# Patient Record
Sex: Female | Born: 1950 | Race: White | Hispanic: No | Marital: Single | Smoking: Never smoker
Health system: Southern US, Community
[De-identification: ages and names within clinical notes are randomized; demographics above are authoritative.]

## PROBLEM LIST (undated history)

## (undated) DIAGNOSIS — F419 Anxiety disorder, unspecified: Secondary | ICD-10-CM

## (undated) DIAGNOSIS — E785 Hyperlipidemia, unspecified: Secondary | ICD-10-CM

## (undated) DIAGNOSIS — F32A Depression, unspecified: Secondary | ICD-10-CM

## (undated) DIAGNOSIS — M2040 Other hammer toe(s) (acquired), unspecified foot: Secondary | ICD-10-CM

## (undated) DIAGNOSIS — F329 Major depressive disorder, single episode, unspecified: Secondary | ICD-10-CM

## (undated) DIAGNOSIS — I10 Essential (primary) hypertension: Secondary | ICD-10-CM

## (undated) DIAGNOSIS — E039 Hypothyroidism, unspecified: Secondary | ICD-10-CM

## (undated) HISTORY — DX: Essential (primary) hypertension: I10

## (undated) HISTORY — DX: Anxiety disorder, unspecified: F41.9

## (undated) HISTORY — DX: Depression, unspecified: F32.A

## (undated) HISTORY — DX: Hypothyroidism, unspecified: E03.9

## (undated) HISTORY — PX: VEIN LIGATION AND STRIPPING: SHX2653

## (undated) HISTORY — DX: Hyperlipidemia, unspecified: E78.5

## (undated) HISTORY — DX: Major depressive disorder, single episode, unspecified: F32.9

---

## 1998-03-29 ENCOUNTER — Encounter: Admission: RE | Admit: 1998-03-29 | Discharge: 1998-03-29 | Payer: Self-pay | Admitting: Sports Medicine

## 1998-08-01 ENCOUNTER — Encounter: Admission: RE | Admit: 1998-08-01 | Discharge: 1998-08-01 | Payer: Self-pay | Admitting: Family Medicine

## 1998-08-24 ENCOUNTER — Encounter: Admission: RE | Admit: 1998-08-24 | Discharge: 1998-08-24 | Payer: Self-pay | Admitting: Family Medicine

## 1998-10-21 ENCOUNTER — Encounter: Admission: RE | Admit: 1998-10-21 | Discharge: 1998-10-21 | Payer: Self-pay | Admitting: Family Medicine

## 1998-12-30 ENCOUNTER — Encounter: Payer: Self-pay | Admitting: Vascular Surgery

## 1999-01-02 ENCOUNTER — Observation Stay (HOSPITAL_COMMUNITY): Admission: RE | Admit: 1999-01-02 | Discharge: 1999-01-03 | Payer: Self-pay | Admitting: Vascular Surgery

## 1999-01-02 ENCOUNTER — Encounter (INDEPENDENT_AMBULATORY_CARE_PROVIDER_SITE_OTHER): Payer: Self-pay | Admitting: Specialist

## 1999-01-24 ENCOUNTER — Encounter: Admission: RE | Admit: 1999-01-24 | Discharge: 1999-01-24 | Payer: Self-pay | Admitting: Sports Medicine

## 1999-02-08 ENCOUNTER — Encounter: Admission: RE | Admit: 1999-02-08 | Discharge: 1999-02-08 | Payer: Self-pay | Admitting: Family Medicine

## 1999-03-29 ENCOUNTER — Encounter: Admission: RE | Admit: 1999-03-29 | Discharge: 1999-03-29 | Payer: Self-pay | Admitting: Family Medicine

## 1999-04-26 ENCOUNTER — Encounter: Admission: RE | Admit: 1999-04-26 | Discharge: 1999-04-26 | Payer: Self-pay | Admitting: Family Medicine

## 1999-09-29 ENCOUNTER — Encounter: Admission: RE | Admit: 1999-09-29 | Discharge: 1999-09-29 | Payer: Self-pay | Admitting: Family Medicine

## 1999-10-17 ENCOUNTER — Encounter: Admission: RE | Admit: 1999-10-17 | Discharge: 1999-10-17 | Payer: Self-pay | Admitting: Family Medicine

## 1999-11-02 ENCOUNTER — Encounter: Admission: RE | Admit: 1999-11-02 | Discharge: 1999-11-02 | Payer: Self-pay | Admitting: Family Medicine

## 1999-11-09 ENCOUNTER — Encounter: Admission: RE | Admit: 1999-11-09 | Discharge: 1999-11-09 | Payer: Self-pay | Admitting: Family Medicine

## 1999-11-14 ENCOUNTER — Encounter: Admission: RE | Admit: 1999-11-14 | Discharge: 1999-11-14 | Payer: Self-pay | Admitting: Sports Medicine

## 1999-11-28 ENCOUNTER — Encounter: Admission: RE | Admit: 1999-11-28 | Discharge: 1999-11-28 | Payer: Self-pay | Admitting: Family Medicine

## 1999-12-01 ENCOUNTER — Encounter: Payer: Self-pay | Admitting: Family Medicine

## 1999-12-01 ENCOUNTER — Encounter: Admission: RE | Admit: 1999-12-01 | Discharge: 1999-12-01 | Payer: Self-pay | Admitting: Family Medicine

## 2000-03-23 ENCOUNTER — Emergency Department (HOSPITAL_COMMUNITY): Admission: EM | Admit: 2000-03-23 | Discharge: 2000-03-23 | Payer: Self-pay | Admitting: Emergency Medicine

## 2000-03-27 ENCOUNTER — Encounter: Admission: RE | Admit: 2000-03-27 | Discharge: 2000-03-27 | Payer: Self-pay | Admitting: Family Medicine

## 2000-03-29 ENCOUNTER — Encounter: Admission: RE | Admit: 2000-03-29 | Discharge: 2000-03-29 | Payer: Self-pay | Admitting: Family Medicine

## 2000-06-07 ENCOUNTER — Encounter: Admission: RE | Admit: 2000-06-07 | Discharge: 2000-06-07 | Payer: Self-pay | Admitting: Family Medicine

## 2000-08-29 ENCOUNTER — Encounter: Admission: RE | Admit: 2000-08-29 | Discharge: 2000-08-29 | Payer: Self-pay | Admitting: Family Medicine

## 2000-09-03 ENCOUNTER — Encounter: Admission: RE | Admit: 2000-09-03 | Discharge: 2000-09-03 | Payer: Self-pay | Admitting: Family Medicine

## 2000-10-22 ENCOUNTER — Encounter: Admission: RE | Admit: 2000-10-22 | Discharge: 2000-10-22 | Payer: Self-pay | Admitting: Family Medicine

## 2001-04-29 ENCOUNTER — Encounter: Admission: RE | Admit: 2001-04-29 | Discharge: 2001-04-29 | Payer: Self-pay | Admitting: Family Medicine

## 2001-08-22 ENCOUNTER — Encounter: Admission: RE | Admit: 2001-08-22 | Discharge: 2001-08-22 | Payer: Self-pay | Admitting: Family Medicine

## 2002-02-10 ENCOUNTER — Encounter: Admission: RE | Admit: 2002-02-10 | Discharge: 2002-02-10 | Payer: Self-pay | Admitting: Family Medicine

## 2002-02-16 ENCOUNTER — Encounter: Payer: Self-pay | Admitting: Family Medicine

## 2002-02-16 ENCOUNTER — Encounter: Admission: RE | Admit: 2002-02-16 | Discharge: 2002-02-16 | Payer: Self-pay | Admitting: Family Medicine

## 2002-03-10 ENCOUNTER — Encounter: Admission: RE | Admit: 2002-03-10 | Discharge: 2002-03-10 | Payer: Self-pay | Admitting: Family Medicine

## 2002-04-09 ENCOUNTER — Encounter: Admission: RE | Admit: 2002-04-09 | Discharge: 2002-04-09 | Payer: Self-pay | Admitting: Family Medicine

## 2002-10-20 ENCOUNTER — Encounter: Admission: RE | Admit: 2002-10-20 | Discharge: 2002-10-20 | Payer: Self-pay | Admitting: Family Medicine

## 2003-01-01 ENCOUNTER — Encounter: Payer: Self-pay | Admitting: Sports Medicine

## 2003-01-01 ENCOUNTER — Encounter: Admission: RE | Admit: 2003-01-01 | Discharge: 2003-01-01 | Payer: Self-pay | Admitting: Family Medicine

## 2003-01-01 ENCOUNTER — Encounter: Admission: RE | Admit: 2003-01-01 | Discharge: 2003-01-01 | Payer: Self-pay | Admitting: Sports Medicine

## 2003-03-30 ENCOUNTER — Encounter: Admission: RE | Admit: 2003-03-30 | Discharge: 2003-03-30 | Payer: Self-pay | Admitting: Family Medicine

## 2003-04-02 ENCOUNTER — Encounter: Admission: RE | Admit: 2003-04-02 | Discharge: 2003-04-02 | Payer: Self-pay | Admitting: Sports Medicine

## 2003-08-31 ENCOUNTER — Encounter: Admission: RE | Admit: 2003-08-31 | Discharge: 2003-08-31 | Payer: Self-pay | Admitting: Family Medicine

## 2003-09-10 ENCOUNTER — Encounter: Admission: RE | Admit: 2003-09-10 | Discharge: 2003-09-10 | Payer: Self-pay | Admitting: Sports Medicine

## 2003-09-27 ENCOUNTER — Encounter: Admission: RE | Admit: 2003-09-27 | Discharge: 2003-12-26 | Payer: Self-pay | Admitting: Family Medicine

## 2004-07-25 ENCOUNTER — Ambulatory Visit: Payer: Self-pay | Admitting: Family Medicine

## 2004-07-31 ENCOUNTER — Ambulatory Visit: Payer: Self-pay | Admitting: Family Medicine

## 2004-08-16 ENCOUNTER — Ambulatory Visit: Payer: Self-pay | Admitting: Family Medicine

## 2004-10-01 ENCOUNTER — Emergency Department (HOSPITAL_COMMUNITY): Admission: EM | Admit: 2004-10-01 | Discharge: 2004-10-01 | Payer: Self-pay | Admitting: Emergency Medicine

## 2004-11-17 ENCOUNTER — Ambulatory Visit: Payer: Self-pay | Admitting: Sports Medicine

## 2004-11-21 ENCOUNTER — Ambulatory Visit: Payer: Self-pay | Admitting: Family Medicine

## 2004-11-30 ENCOUNTER — Ambulatory Visit: Payer: Self-pay | Admitting: Family Medicine

## 2004-12-15 ENCOUNTER — Encounter: Admission: RE | Admit: 2004-12-15 | Discharge: 2004-12-15 | Payer: Self-pay | Admitting: Family Medicine

## 2004-12-19 ENCOUNTER — Ambulatory Visit: Payer: Self-pay | Admitting: Family Medicine

## 2005-01-26 ENCOUNTER — Ambulatory Visit: Payer: Self-pay | Admitting: Sports Medicine

## 2005-02-22 ENCOUNTER — Ambulatory Visit: Payer: Self-pay | Admitting: Family Medicine

## 2005-03-06 ENCOUNTER — Ambulatory Visit: Payer: Self-pay | Admitting: Family Medicine

## 2005-07-30 ENCOUNTER — Ambulatory Visit: Payer: Self-pay | Admitting: Sports Medicine

## 2005-08-02 ENCOUNTER — Emergency Department (HOSPITAL_COMMUNITY): Admission: EM | Admit: 2005-08-02 | Discharge: 2005-08-02 | Payer: Self-pay | Admitting: Emergency Medicine

## 2005-08-07 ENCOUNTER — Ambulatory Visit: Payer: Self-pay | Admitting: Family Medicine

## 2005-08-13 ENCOUNTER — Ambulatory Visit: Payer: Self-pay | Admitting: Family Medicine

## 2005-09-18 ENCOUNTER — Emergency Department (HOSPITAL_COMMUNITY): Admission: EM | Admit: 2005-09-18 | Discharge: 2005-09-18 | Payer: Self-pay | Admitting: Emergency Medicine

## 2005-09-18 ENCOUNTER — Ambulatory Visit: Payer: Self-pay | Admitting: Family Medicine

## 2005-09-20 ENCOUNTER — Ambulatory Visit: Payer: Self-pay | Admitting: Family Medicine

## 2005-09-21 ENCOUNTER — Ambulatory Visit: Payer: Self-pay | Admitting: Family Medicine

## 2005-09-25 ENCOUNTER — Encounter (INDEPENDENT_AMBULATORY_CARE_PROVIDER_SITE_OTHER): Payer: Self-pay | Admitting: *Deleted

## 2005-09-25 ENCOUNTER — Ambulatory Visit: Payer: Self-pay | Admitting: Family Medicine

## 2005-10-09 ENCOUNTER — Encounter: Payer: Self-pay | Admitting: Family Medicine

## 2005-10-09 ENCOUNTER — Ambulatory Visit: Payer: Self-pay | Admitting: Family Medicine

## 2005-10-09 ENCOUNTER — Ambulatory Visit (HOSPITAL_COMMUNITY): Admission: RE | Admit: 2005-10-09 | Discharge: 2005-10-09 | Payer: Self-pay | Admitting: Family Medicine

## 2005-10-16 ENCOUNTER — Ambulatory Visit: Payer: Self-pay | Admitting: Family Medicine

## 2005-10-29 ENCOUNTER — Ambulatory Visit: Payer: Self-pay | Admitting: Family Medicine

## 2005-10-29 ENCOUNTER — Inpatient Hospital Stay (HOSPITAL_COMMUNITY): Admission: RE | Admit: 2005-10-29 | Discharge: 2005-11-04 | Payer: Self-pay | Admitting: Psychiatry

## 2005-10-29 ENCOUNTER — Ambulatory Visit: Payer: Self-pay | Admitting: Psychiatry

## 2005-11-06 ENCOUNTER — Ambulatory Visit (HOSPITAL_COMMUNITY): Admission: RE | Admit: 2005-11-06 | Discharge: 2005-11-06 | Payer: Self-pay | Admitting: Family Medicine

## 2005-11-06 ENCOUNTER — Ambulatory Visit: Payer: Self-pay | Admitting: Family Medicine

## 2005-11-07 ENCOUNTER — Ambulatory Visit: Payer: Self-pay | Admitting: Family Medicine

## 2005-11-12 ENCOUNTER — Ambulatory Visit: Payer: Self-pay | Admitting: Family Medicine

## 2005-11-14 ENCOUNTER — Emergency Department (HOSPITAL_COMMUNITY): Admission: EM | Admit: 2005-11-14 | Discharge: 2005-11-14 | Payer: Self-pay | Admitting: Emergency Medicine

## 2005-11-15 ENCOUNTER — Ambulatory Visit: Payer: Self-pay | Admitting: Sports Medicine

## 2005-11-17 ENCOUNTER — Emergency Department (HOSPITAL_COMMUNITY): Admission: EM | Admit: 2005-11-17 | Discharge: 2005-11-17 | Payer: Self-pay | Admitting: Emergency Medicine

## 2005-11-19 ENCOUNTER — Ambulatory Visit: Payer: Self-pay | Admitting: Family Medicine

## 2005-11-20 ENCOUNTER — Ambulatory Visit: Payer: Self-pay | Admitting: Family Medicine

## 2005-11-23 ENCOUNTER — Ambulatory Visit: Payer: Self-pay | Admitting: Family Medicine

## 2005-11-27 ENCOUNTER — Ambulatory Visit: Payer: Self-pay | Admitting: Sports Medicine

## 2005-12-02 ENCOUNTER — Emergency Department (HOSPITAL_COMMUNITY): Admission: EM | Admit: 2005-12-02 | Discharge: 2005-12-02 | Payer: Self-pay | Admitting: Emergency Medicine

## 2005-12-04 ENCOUNTER — Ambulatory Visit: Payer: Self-pay | Admitting: Family Medicine

## 2005-12-11 ENCOUNTER — Ambulatory Visit: Payer: Self-pay | Admitting: Sports Medicine

## 2005-12-16 ENCOUNTER — Emergency Department (HOSPITAL_COMMUNITY): Admission: EM | Admit: 2005-12-16 | Discharge: 2005-12-17 | Payer: Self-pay | Admitting: Emergency Medicine

## 2005-12-25 ENCOUNTER — Ambulatory Visit: Payer: Self-pay | Admitting: Family Medicine

## 2006-01-03 ENCOUNTER — Ambulatory Visit: Payer: Self-pay | Admitting: Family Medicine

## 2006-01-09 ENCOUNTER — Ambulatory Visit: Payer: Self-pay | Admitting: Family Medicine

## 2006-01-11 ENCOUNTER — Ambulatory Visit: Payer: Self-pay | Admitting: Family Medicine

## 2006-01-12 ENCOUNTER — Emergency Department (HOSPITAL_COMMUNITY): Admission: EM | Admit: 2006-01-12 | Discharge: 2006-01-12 | Payer: Self-pay | Admitting: Emergency Medicine

## 2006-01-17 ENCOUNTER — Ambulatory Visit: Payer: Self-pay | Admitting: Family Medicine

## 2006-01-18 ENCOUNTER — Emergency Department (HOSPITAL_COMMUNITY): Admission: EM | Admit: 2006-01-18 | Discharge: 2006-01-19 | Payer: Self-pay | Admitting: Emergency Medicine

## 2006-02-01 ENCOUNTER — Ambulatory Visit: Payer: Self-pay | Admitting: Family Medicine

## 2006-02-11 ENCOUNTER — Ambulatory Visit: Payer: Self-pay | Admitting: Family Medicine

## 2006-02-18 ENCOUNTER — Ambulatory Visit: Payer: Self-pay | Admitting: Sports Medicine

## 2006-02-28 ENCOUNTER — Ambulatory Visit: Payer: Self-pay | Admitting: Family Medicine

## 2006-03-11 ENCOUNTER — Encounter: Payer: Self-pay | Admitting: Family Medicine

## 2006-03-11 ENCOUNTER — Ambulatory Visit: Payer: Self-pay | Admitting: Sports Medicine

## 2006-03-11 LAB — CONVERTED CEMR LAB: LDL Cholesterol: 257 mg/dL

## 2006-03-25 ENCOUNTER — Ambulatory Visit: Payer: Self-pay | Admitting: Sports Medicine

## 2006-04-01 ENCOUNTER — Ambulatory Visit: Payer: Self-pay | Admitting: Sports Medicine

## 2006-05-07 ENCOUNTER — Ambulatory Visit: Payer: Self-pay | Admitting: Family Medicine

## 2006-05-08 ENCOUNTER — Ambulatory Visit: Payer: Self-pay | Admitting: Family Medicine

## 2006-05-10 ENCOUNTER — Ambulatory Visit: Payer: Self-pay | Admitting: Sports Medicine

## 2006-05-10 ENCOUNTER — Emergency Department (HOSPITAL_COMMUNITY): Admission: EM | Admit: 2006-05-10 | Discharge: 2006-05-10 | Payer: Self-pay | Admitting: Emergency Medicine

## 2006-05-11 ENCOUNTER — Emergency Department (HOSPITAL_COMMUNITY): Admission: EM | Admit: 2006-05-11 | Discharge: 2006-05-11 | Payer: Self-pay | Admitting: Emergency Medicine

## 2006-05-14 ENCOUNTER — Ambulatory Visit: Payer: Self-pay | Admitting: Family Medicine

## 2006-05-15 ENCOUNTER — Emergency Department (HOSPITAL_COMMUNITY): Admission: EM | Admit: 2006-05-15 | Discharge: 2006-05-15 | Payer: Self-pay | Admitting: Emergency Medicine

## 2006-05-17 ENCOUNTER — Ambulatory Visit: Payer: Self-pay | Admitting: Family Medicine

## 2006-05-19 ENCOUNTER — Emergency Department (HOSPITAL_COMMUNITY): Admission: EM | Admit: 2006-05-19 | Discharge: 2006-05-19 | Payer: Self-pay | Admitting: Emergency Medicine

## 2006-05-21 ENCOUNTER — Emergency Department (HOSPITAL_COMMUNITY): Admission: EM | Admit: 2006-05-21 | Discharge: 2006-05-21 | Payer: Self-pay | Admitting: Emergency Medicine

## 2006-05-23 ENCOUNTER — Emergency Department (HOSPITAL_COMMUNITY): Admission: EM | Admit: 2006-05-23 | Discharge: 2006-05-23 | Payer: Self-pay | Admitting: Emergency Medicine

## 2006-05-24 ENCOUNTER — Emergency Department (HOSPITAL_COMMUNITY): Admission: EM | Admit: 2006-05-24 | Discharge: 2006-05-25 | Payer: Self-pay | Admitting: Emergency Medicine

## 2006-05-26 ENCOUNTER — Emergency Department (HOSPITAL_COMMUNITY): Admission: EM | Admit: 2006-05-26 | Discharge: 2006-05-26 | Payer: Self-pay | Admitting: Emergency Medicine

## 2006-05-30 ENCOUNTER — Emergency Department (HOSPITAL_COMMUNITY): Admission: EM | Admit: 2006-05-30 | Discharge: 2006-05-30 | Payer: Self-pay | Admitting: Family Medicine

## 2006-05-30 ENCOUNTER — Emergency Department (HOSPITAL_COMMUNITY): Admission: EM | Admit: 2006-05-30 | Discharge: 2006-05-30 | Payer: Self-pay | Admitting: Emergency Medicine

## 2006-05-31 ENCOUNTER — Ambulatory Visit: Payer: Self-pay | Admitting: Family Medicine

## 2006-05-31 ENCOUNTER — Emergency Department (HOSPITAL_COMMUNITY): Admission: EM | Admit: 2006-05-31 | Discharge: 2006-05-31 | Payer: Self-pay | Admitting: Emergency Medicine

## 2006-06-01 ENCOUNTER — Emergency Department (HOSPITAL_COMMUNITY): Admission: EM | Admit: 2006-06-01 | Discharge: 2006-06-01 | Payer: Self-pay | Admitting: Emergency Medicine

## 2006-06-02 ENCOUNTER — Emergency Department (HOSPITAL_COMMUNITY): Admission: EM | Admit: 2006-06-02 | Discharge: 2006-06-02 | Payer: Self-pay | Admitting: Emergency Medicine

## 2006-06-04 ENCOUNTER — Emergency Department (HOSPITAL_COMMUNITY): Admission: EM | Admit: 2006-06-04 | Discharge: 2006-06-04 | Payer: Self-pay | Admitting: Emergency Medicine

## 2006-06-05 ENCOUNTER — Ambulatory Visit: Payer: Self-pay | Admitting: Family Medicine

## 2006-06-10 ENCOUNTER — Inpatient Hospital Stay (HOSPITAL_COMMUNITY): Admission: AD | Admit: 2006-06-10 | Discharge: 2006-06-15 | Payer: Self-pay | Admitting: *Deleted

## 2006-06-10 ENCOUNTER — Ambulatory Visit: Payer: Self-pay | Admitting: *Deleted

## 2006-07-09 ENCOUNTER — Ambulatory Visit: Payer: Self-pay | Admitting: Family Medicine

## 2006-07-16 ENCOUNTER — Emergency Department (HOSPITAL_COMMUNITY): Admission: EM | Admit: 2006-07-16 | Discharge: 2006-07-16 | Payer: Self-pay | Admitting: Emergency Medicine

## 2006-07-19 ENCOUNTER — Emergency Department (HOSPITAL_COMMUNITY): Admission: EM | Admit: 2006-07-19 | Discharge: 2006-07-19 | Payer: Self-pay | Admitting: Emergency Medicine

## 2006-07-25 DIAGNOSIS — E785 Hyperlipidemia, unspecified: Secondary | ICD-10-CM

## 2006-07-25 DIAGNOSIS — F29 Unspecified psychosis not due to a substance or known physiological condition: Secondary | ICD-10-CM | POA: Insufficient documentation

## 2006-07-25 DIAGNOSIS — I8390 Asymptomatic varicose veins of unspecified lower extremity: Secondary | ICD-10-CM

## 2006-07-25 DIAGNOSIS — G5600 Carpal tunnel syndrome, unspecified upper limb: Secondary | ICD-10-CM

## 2006-07-25 DIAGNOSIS — H9319 Tinnitus, unspecified ear: Secondary | ICD-10-CM | POA: Insufficient documentation

## 2006-07-25 DIAGNOSIS — E049 Nontoxic goiter, unspecified: Secondary | ICD-10-CM | POA: Insufficient documentation

## 2006-07-25 DIAGNOSIS — M949 Disorder of cartilage, unspecified: Secondary | ICD-10-CM

## 2006-07-25 DIAGNOSIS — M899 Disorder of bone, unspecified: Secondary | ICD-10-CM | POA: Insufficient documentation

## 2006-07-25 DIAGNOSIS — J309 Allergic rhinitis, unspecified: Secondary | ICD-10-CM | POA: Insufficient documentation

## 2006-07-25 DIAGNOSIS — F319 Bipolar disorder, unspecified: Secondary | ICD-10-CM | POA: Insufficient documentation

## 2006-07-25 DIAGNOSIS — E669 Obesity, unspecified: Secondary | ICD-10-CM

## 2006-07-26 ENCOUNTER — Encounter (INDEPENDENT_AMBULATORY_CARE_PROVIDER_SITE_OTHER): Payer: Self-pay | Admitting: *Deleted

## 2006-07-30 ENCOUNTER — Encounter: Payer: Self-pay | Admitting: Family Medicine

## 2006-07-30 ENCOUNTER — Telehealth: Payer: Self-pay | Admitting: *Deleted

## 2006-08-02 ENCOUNTER — Telehealth: Payer: Self-pay | Admitting: *Deleted

## 2006-08-04 ENCOUNTER — Emergency Department (HOSPITAL_COMMUNITY): Admission: EM | Admit: 2006-08-04 | Discharge: 2006-08-04 | Payer: Self-pay | Admitting: Emergency Medicine

## 2006-08-29 ENCOUNTER — Telehealth (INDEPENDENT_AMBULATORY_CARE_PROVIDER_SITE_OTHER): Payer: Self-pay | Admitting: Family Medicine

## 2006-09-03 ENCOUNTER — Emergency Department (HOSPITAL_COMMUNITY): Admission: EM | Admit: 2006-09-03 | Discharge: 2006-09-03 | Payer: Self-pay | Admitting: Emergency Medicine

## 2006-09-16 ENCOUNTER — Telehealth: Payer: Self-pay | Admitting: Family Medicine

## 2006-09-16 ENCOUNTER — Encounter: Payer: Self-pay | Admitting: Family Medicine

## 2006-09-26 ENCOUNTER — Telehealth: Payer: Self-pay | Admitting: *Deleted

## 2006-10-09 ENCOUNTER — Ambulatory Visit: Payer: Self-pay | Admitting: Family Medicine

## 2006-10-15 ENCOUNTER — Encounter: Payer: Self-pay | Admitting: *Deleted

## 2006-10-16 ENCOUNTER — Telehealth: Payer: Self-pay | Admitting: *Deleted

## 2006-10-17 ENCOUNTER — Encounter (INDEPENDENT_AMBULATORY_CARE_PROVIDER_SITE_OTHER): Payer: Self-pay | Admitting: *Deleted

## 2006-10-22 ENCOUNTER — Ambulatory Visit: Payer: Self-pay | Admitting: Family Medicine

## 2006-11-10 ENCOUNTER — Emergency Department (HOSPITAL_COMMUNITY): Admission: EM | Admit: 2006-11-10 | Discharge: 2006-11-11 | Payer: Self-pay | Admitting: Emergency Medicine

## 2006-11-23 ENCOUNTER — Telehealth (INDEPENDENT_AMBULATORY_CARE_PROVIDER_SITE_OTHER): Payer: Self-pay | Admitting: Family Medicine

## 2006-12-10 ENCOUNTER — Telehealth: Payer: Self-pay | Admitting: Family Medicine

## 2007-01-21 ENCOUNTER — Encounter: Payer: Self-pay | Admitting: *Deleted

## 2007-03-10 ENCOUNTER — Telehealth: Payer: Self-pay | Admitting: *Deleted

## 2007-03-11 ENCOUNTER — Encounter: Payer: Self-pay | Admitting: *Deleted

## 2007-03-11 ENCOUNTER — Ambulatory Visit: Payer: Self-pay | Admitting: Family Medicine

## 2007-03-15 ENCOUNTER — Telehealth (INDEPENDENT_AMBULATORY_CARE_PROVIDER_SITE_OTHER): Payer: Self-pay | Admitting: Family Medicine

## 2007-03-16 ENCOUNTER — Telehealth (INDEPENDENT_AMBULATORY_CARE_PROVIDER_SITE_OTHER): Payer: Self-pay | Admitting: Family Medicine

## 2007-03-19 ENCOUNTER — Encounter: Payer: Self-pay | Admitting: Family Medicine

## 2007-03-19 ENCOUNTER — Encounter: Payer: Self-pay | Admitting: *Deleted

## 2007-03-31 ENCOUNTER — Telehealth: Payer: Self-pay | Admitting: Family Medicine

## 2007-04-01 ENCOUNTER — Telehealth: Payer: Self-pay | Admitting: Family Medicine

## 2007-04-03 ENCOUNTER — Encounter: Payer: Self-pay | Admitting: *Deleted

## 2007-04-08 ENCOUNTER — Encounter: Payer: Self-pay | Admitting: *Deleted

## 2007-04-09 ENCOUNTER — Encounter: Payer: Self-pay | Admitting: *Deleted

## 2007-04-15 ENCOUNTER — Telehealth (INDEPENDENT_AMBULATORY_CARE_PROVIDER_SITE_OTHER): Payer: Self-pay | Admitting: Family Medicine

## 2007-04-16 ENCOUNTER — Encounter: Payer: Self-pay | Admitting: *Deleted

## 2007-04-22 ENCOUNTER — Emergency Department (HOSPITAL_COMMUNITY): Admission: EM | Admit: 2007-04-22 | Discharge: 2007-04-22 | Payer: Self-pay | Admitting: Emergency Medicine

## 2007-04-22 ENCOUNTER — Ambulatory Visit: Payer: Self-pay | Admitting: Family Medicine

## 2007-04-22 DIAGNOSIS — T7421XA Adult sexual abuse, confirmed, initial encounter: Secondary | ICD-10-CM

## 2007-04-22 LAB — CONVERTED CEMR LAB
ALT: 8 units/L (ref 0–35)
Albumin: 4 g/dL (ref 3.5–5.2)
CO2: 18 meq/L — ABNORMAL LOW (ref 19–32)
Calcium: 9 mg/dL (ref 8.4–10.5)
HCT: 47.5 % — ABNORMAL HIGH (ref 36.0–46.0)
MCHC: 32.2 g/dL (ref 30.0–36.0)
MCV: 97.7 fL (ref 78.0–100.0)
Platelets: 349 10*3/uL (ref 150–400)
Potassium: 4.2 meq/L (ref 3.5–5.3)
RBC: 4.86 M/uL (ref 3.87–5.11)
RDW: 13 % (ref 11.5–15.5)
TSH: 2.462 microintl units/mL (ref 0.350–5.50)
Whiff Test: NEGATIVE

## 2007-04-26 ENCOUNTER — Inpatient Hospital Stay (HOSPITAL_COMMUNITY): Admission: EM | Admit: 2007-04-26 | Discharge: 2007-05-07 | Payer: Self-pay | Admitting: Psychiatry

## 2007-04-28 ENCOUNTER — Ambulatory Visit: Payer: Self-pay | Admitting: Psychiatry

## 2007-07-15 ENCOUNTER — Ambulatory Visit: Payer: Self-pay | Admitting: Family Medicine

## 2007-07-15 ENCOUNTER — Ambulatory Visit (HOSPITAL_COMMUNITY): Admission: RE | Admit: 2007-07-15 | Discharge: 2007-07-15 | Payer: Self-pay | Admitting: Family Medicine

## 2007-07-15 DIAGNOSIS — R0789 Other chest pain: Secondary | ICD-10-CM

## 2007-07-15 LAB — CONVERTED CEMR LAB
Cholesterol, target level: 200 mg/dL
HDL goal, serum: 40 mg/dL

## 2007-07-21 ENCOUNTER — Encounter: Payer: Self-pay | Admitting: Family Medicine

## 2007-10-08 ENCOUNTER — Telehealth: Payer: Self-pay | Admitting: Family Medicine

## 2007-10-09 ENCOUNTER — Telehealth: Payer: Self-pay | Admitting: Family Medicine

## 2007-10-22 ENCOUNTER — Emergency Department (HOSPITAL_COMMUNITY): Admission: EM | Admit: 2007-10-22 | Discharge: 2007-10-22 | Payer: Self-pay | Admitting: Emergency Medicine

## 2007-10-27 ENCOUNTER — Telehealth: Payer: Self-pay | Admitting: *Deleted

## 2007-10-28 ENCOUNTER — Encounter: Payer: Self-pay | Admitting: *Deleted

## 2007-10-28 ENCOUNTER — Encounter: Payer: Self-pay | Admitting: Family Medicine

## 2007-11-02 ENCOUNTER — Inpatient Hospital Stay (HOSPITAL_COMMUNITY): Admission: AD | Admit: 2007-11-02 | Discharge: 2007-11-02 | Payer: Self-pay | Admitting: Obstetrics and Gynecology

## 2007-11-05 ENCOUNTER — Telehealth (INDEPENDENT_AMBULATORY_CARE_PROVIDER_SITE_OTHER): Payer: Self-pay | Admitting: Family Medicine

## 2007-11-09 ENCOUNTER — Emergency Department (HOSPITAL_COMMUNITY): Admission: EM | Admit: 2007-11-09 | Discharge: 2007-11-09 | Payer: Self-pay | Admitting: Emergency Medicine

## 2007-11-10 ENCOUNTER — Encounter: Payer: Self-pay | Admitting: *Deleted

## 2007-11-10 ENCOUNTER — Ambulatory Visit: Payer: Self-pay | Admitting: Family Medicine

## 2007-11-16 ENCOUNTER — Inpatient Hospital Stay (HOSPITAL_COMMUNITY): Admission: AD | Admit: 2007-11-16 | Discharge: 2007-12-04 | Payer: Self-pay | Admitting: Psychiatry

## 2007-11-16 ENCOUNTER — Other Ambulatory Visit: Payer: Self-pay | Admitting: Emergency Medicine

## 2007-11-16 ENCOUNTER — Ambulatory Visit: Payer: Self-pay | Admitting: Psychiatry

## 2007-11-17 ENCOUNTER — Telehealth: Payer: Self-pay | Admitting: Family Medicine

## 2007-11-28 ENCOUNTER — Encounter: Payer: Self-pay | Admitting: Psychiatry

## 2009-01-06 IMAGING — CR DG CHEST 2V
2 series · 2 of 2 positions shown · non-contrast
Comparison: 09/18/2005

CLINICAL DATA: Chest pain

CHEST - 2 VIEW

[w chest pa]
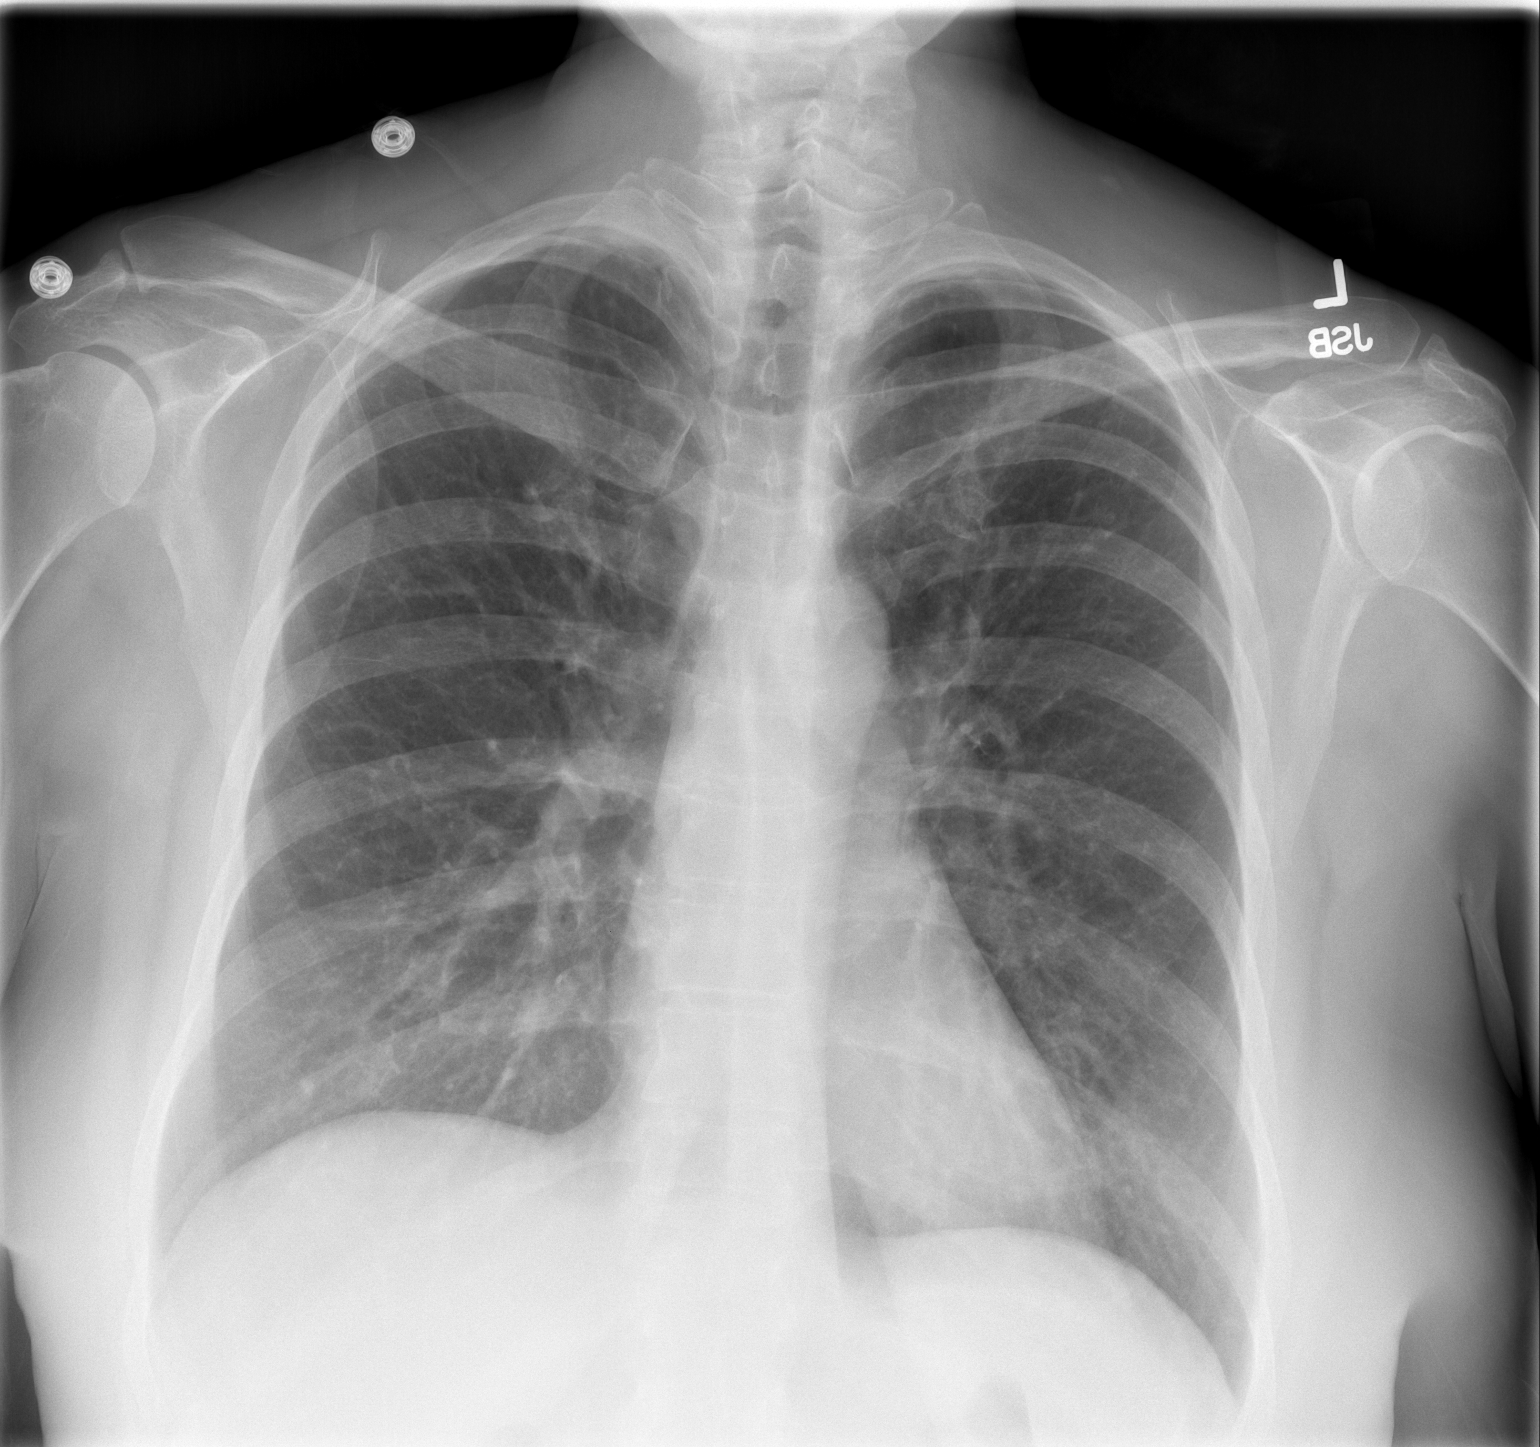

[w chest lat]
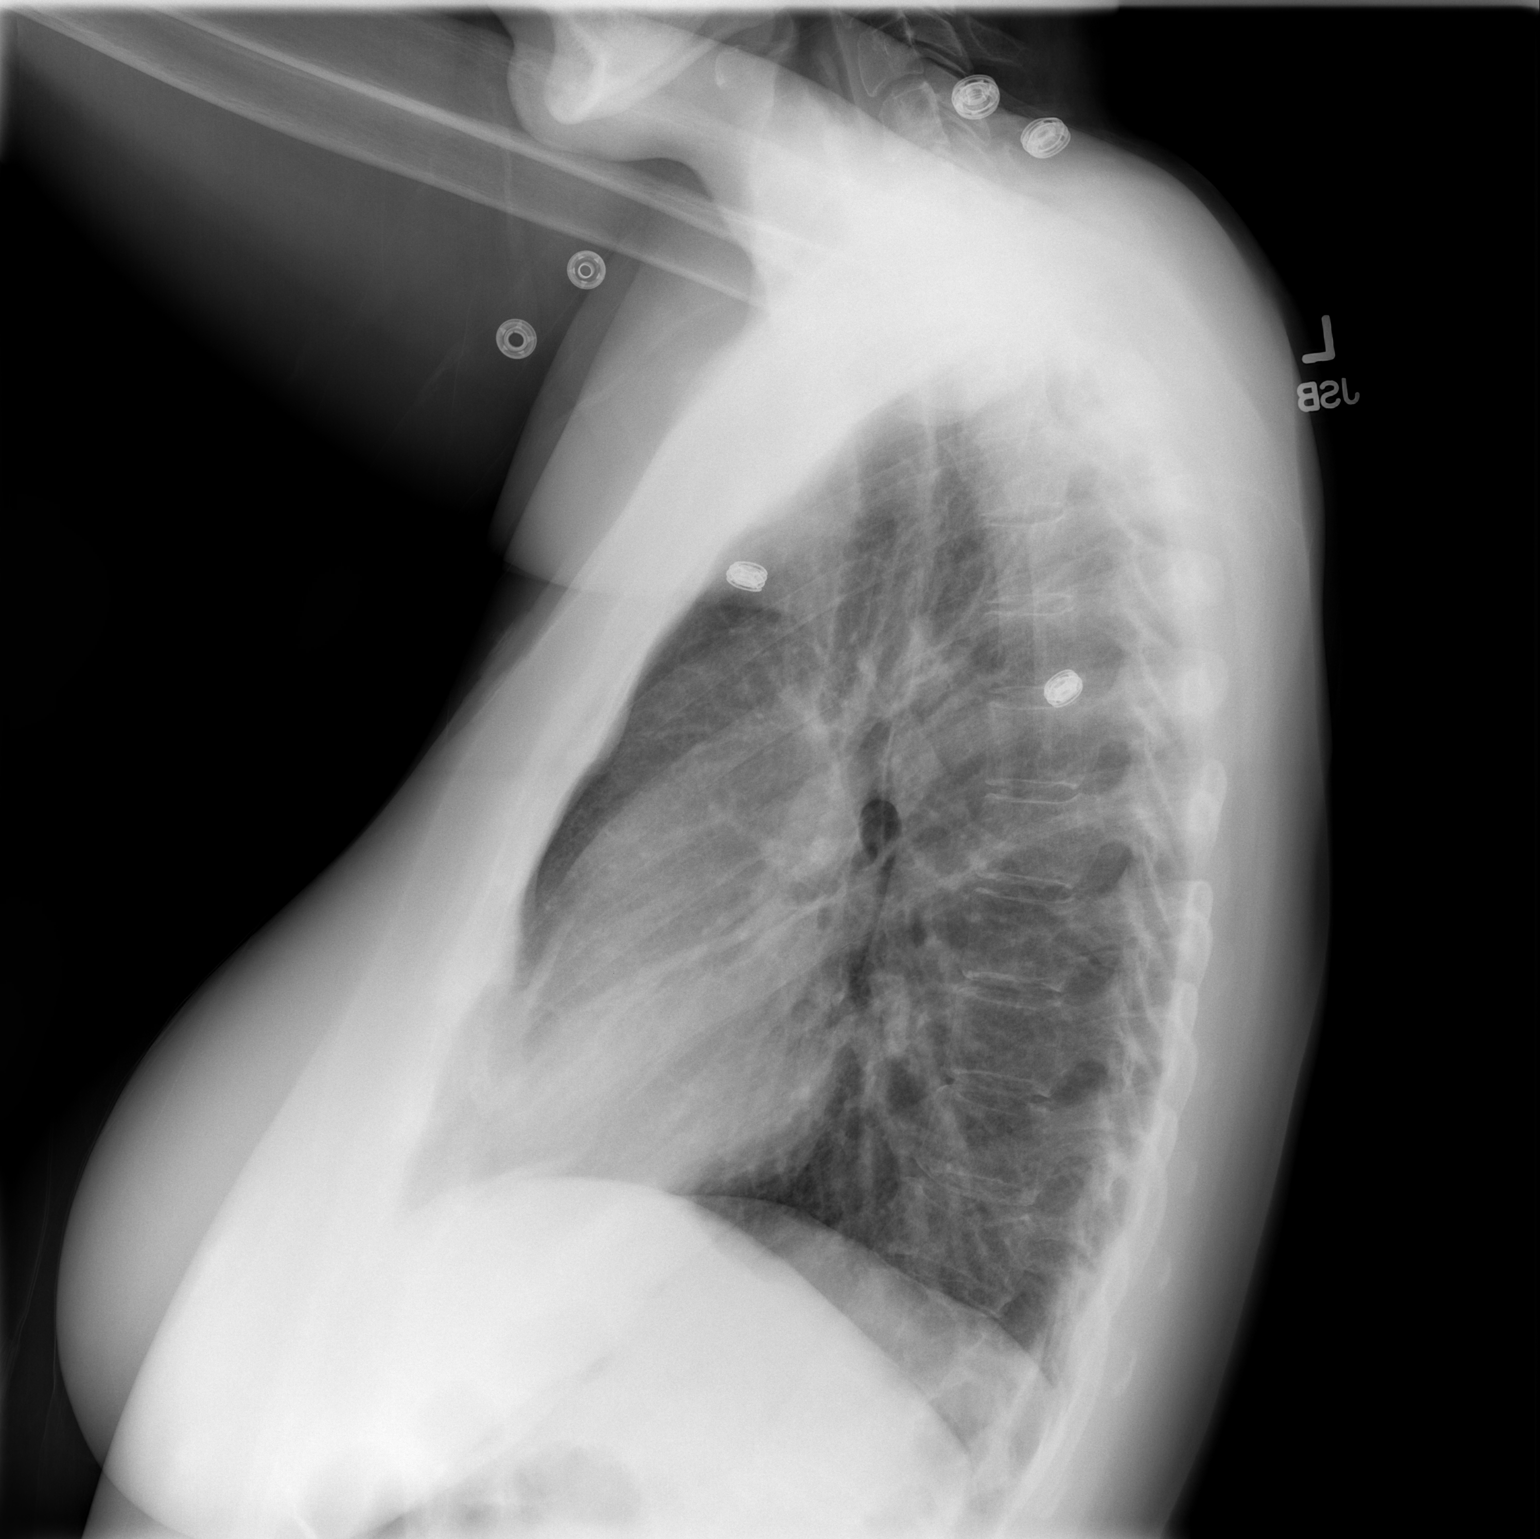

[2 of 2 positions shown; findings below may reference images not displayed]

FINDINGS: Lungs clear.  Heart size and pulmonary vascularity
normal.  No effusion.  Visualized bones unremarkable.]
IMPRESSION: [1.  No acute disease

## 2009-02-08 ENCOUNTER — Encounter: Payer: Self-pay | Admitting: Family Medicine

## 2009-02-12 IMAGING — CR DG CHEST 2V
2 series · 2 of 2 positions shown · non-contrast
Comparison: Chest x-ray 10/22/2007

CLINICAL DATA: Leukocytosis.

CHEST - 2 VIEW

[w chest pa]
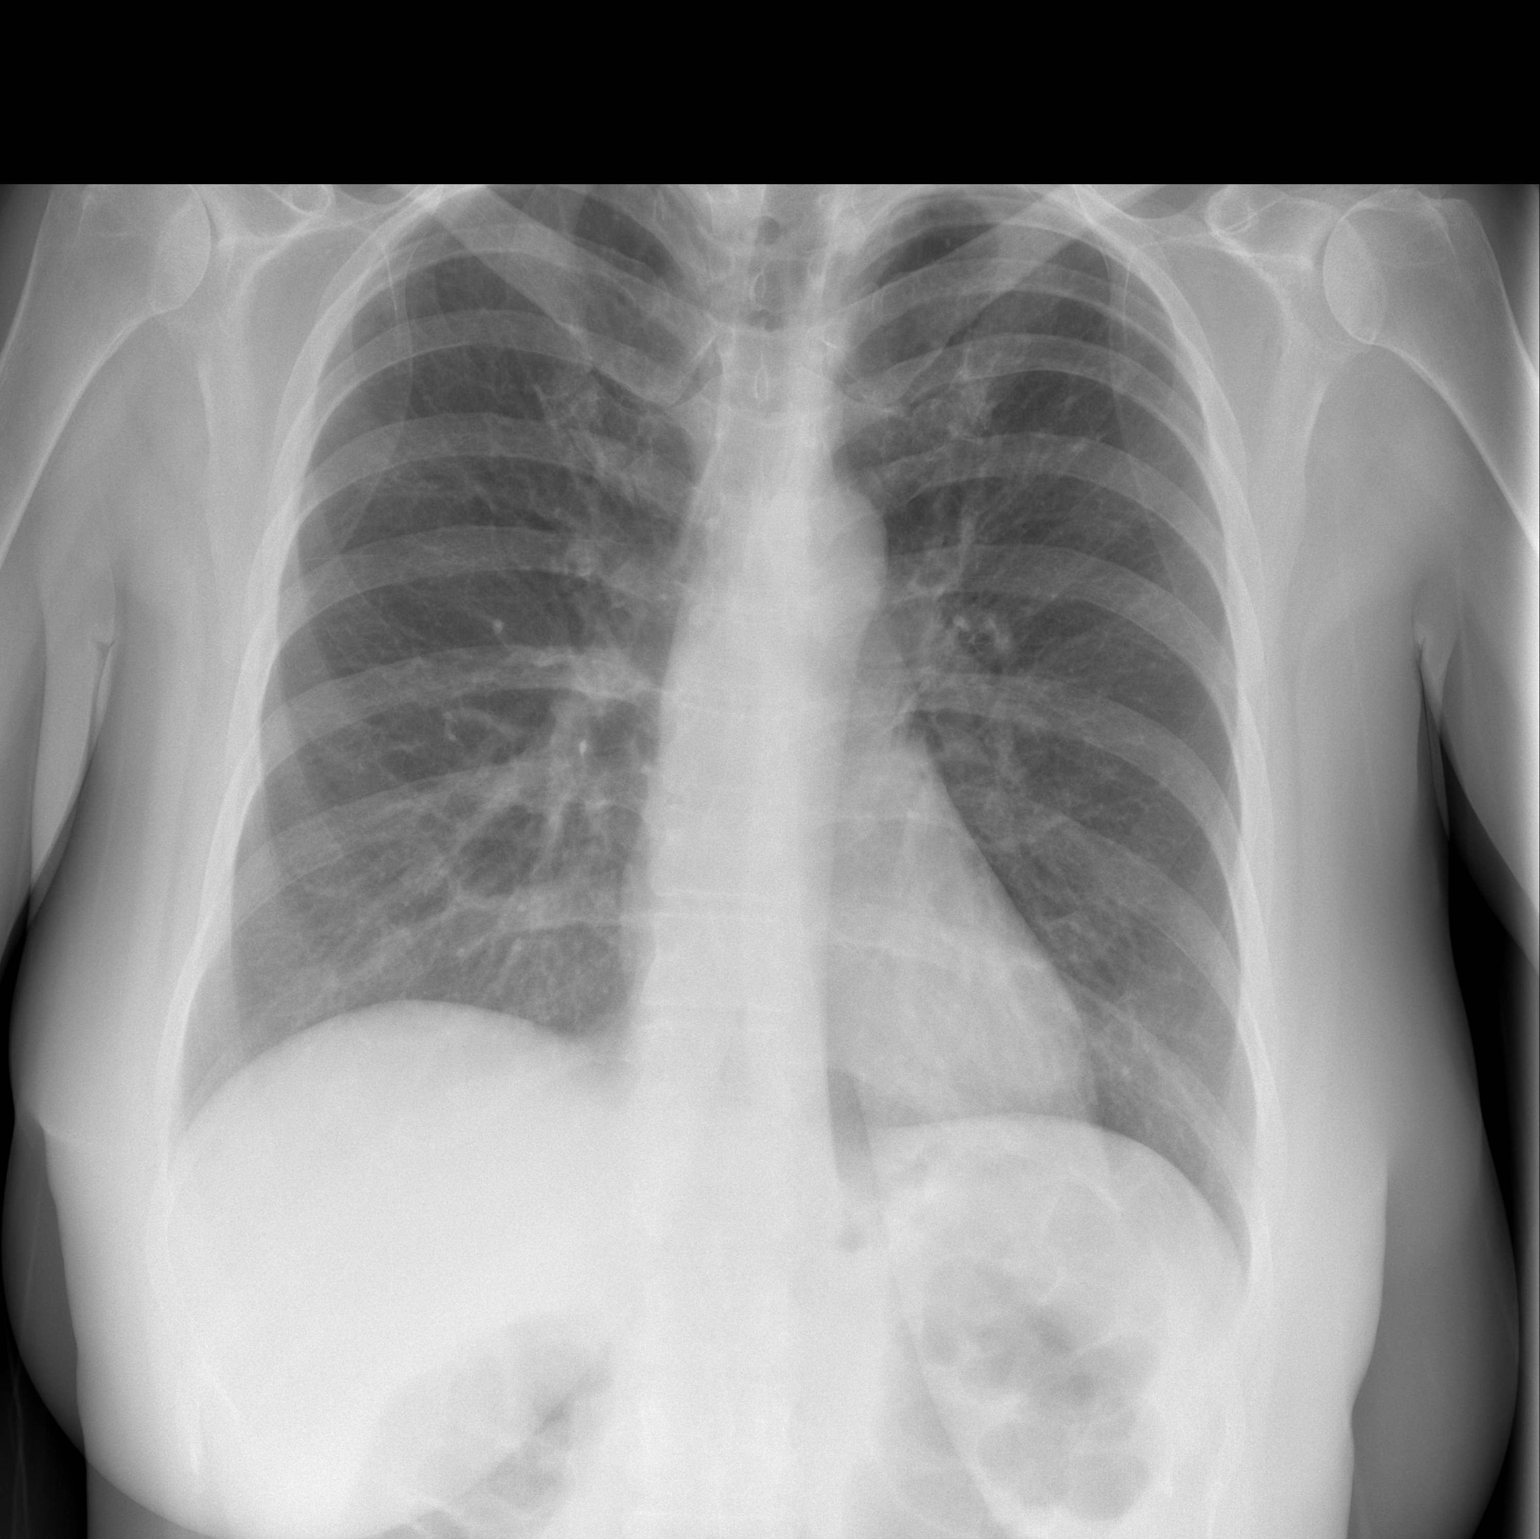

[w chest lat]
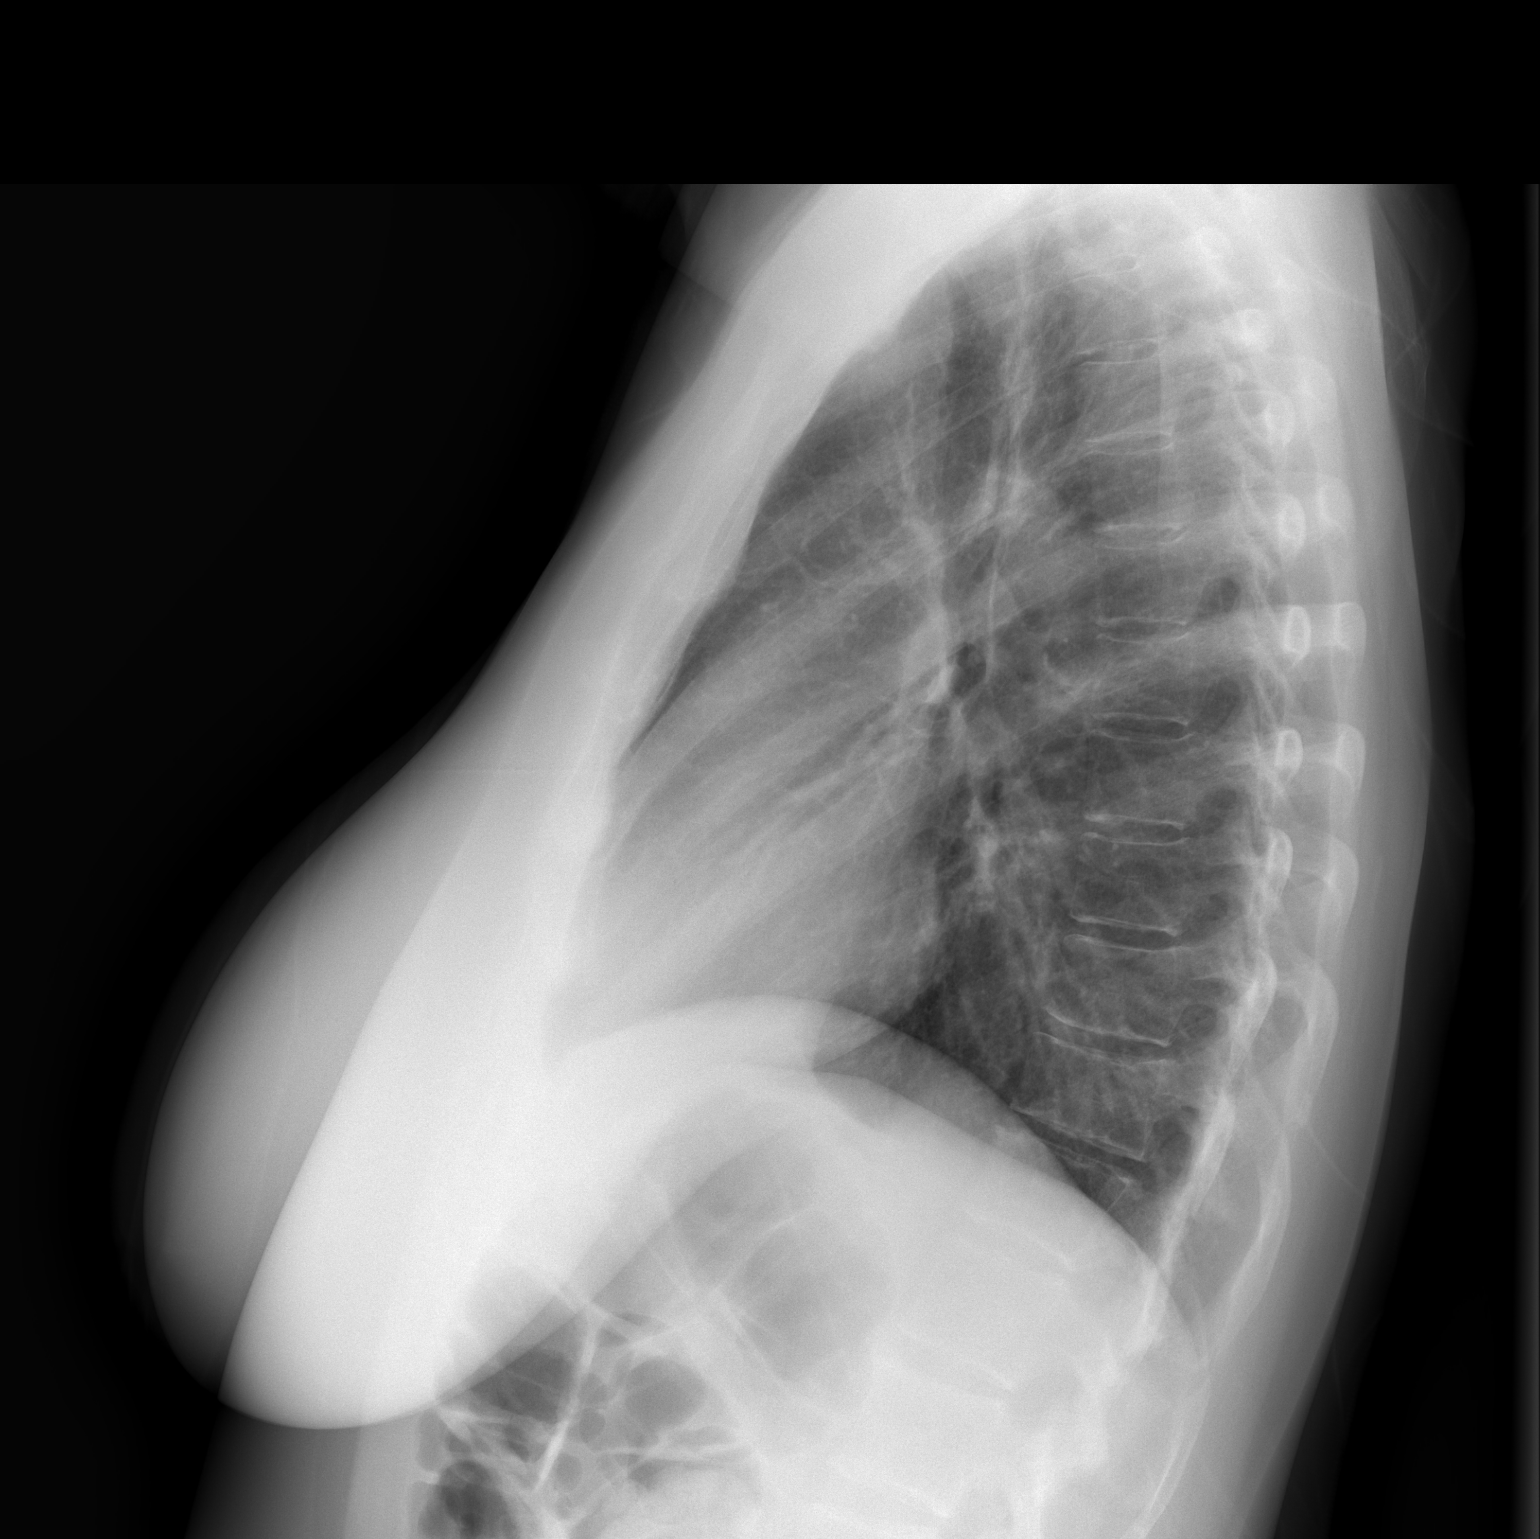

[2 of 2 positions shown; findings below may reference images not displayed]

FINDINGS: Heart and mediastinal contours are normal.  Lung volumes
are normal.  There is no pneumothorax.  No focal airspace disease
is identified.  There is no pleural effusion or edema.  No acute
osseous abnormality is appreciated.
IMPRESSION: No evidence of acute cardiopulmonary disease.

## 2009-03-22 ENCOUNTER — Encounter: Payer: Self-pay | Admitting: Family Medicine

## 2010-06-17 ENCOUNTER — Encounter: Payer: Self-pay | Admitting: Family Medicine

## 2010-07-02 ENCOUNTER — Encounter: Payer: Self-pay | Admitting: *Deleted
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
## 2010-10-13 NOTE — Discharge Summary (Signed)
NAME:  Lindsey Potter, Lindsey Potter                  ACCOUNT NO.:  [id number]   MEDICAL RECORD NO.:  [id number]          PATIENT TYPE:  IPS   LOCATION:  0401                          FACILITY:  BH   PHYSICIAN:  Anselm Jungling, MD  DATE OF BIRTH:  [birth year]   DATE OF ADMISSION:  11/16/2007  DATE OF DISCHARGE:  12/04/2007                               DISCHARGE SUMMARY   IDENTIFYING GUIDE/REASON FOR REFERRAL:  The patient is a 60 year old  unmarried Caucasian female.  This was one of several admissions to our  inpatient service for Lindsey Potter, who returned to [place] with a [place] history of  severe mental illness.  She presented in a severely decompensated state.  Her decompensation had likely been brought about by medication  noncompliance which historically had been a problem for her.  Please  refer to the admission note for further details pertaining to the  symptoms, circumstances and history that led to her hospitalization.  She was given an initial Axis I diagnosis of psychosis NOS.   MEDICAL AND LABORATORY:  The patient was medically and physically  assessed by the psychiatric nurse practitioner.  She had a history of  chronic pain, and had been taking Crestor prior to admission.  These  were continued, along with Neurontin 300 mg t.i.d.   The patient did receive a course of Cipro for 3 days, for presumed  urinary tract infection.  Aside from this, there were no significant  medical issues.  She was given a tuberculosis skin test prior to  discharge, in anticipation of group home placement.   HOSPITAL COURSE:  The patient was admitted to the adult inpatient  Psychiatric service.  She presented as a disheveled, disorganized  individual who nonetheless was alert, fully oriented, pleasant,  friendly, and social.  She was unable to say why she was back in the  hospital.  She displayed extremely limited insight.  however, she was  open to being treated.   Lindsey Potter, who has both an outside case manager and  a legal guardian, had  been living in her own apartment but, in her state of decompensation,  had not been keeping it up, nor had she been taking her medications or  taking care of Potter.  Her apartment was reportedly in severe  disarray.  As it had become an increasing pattern for Lindsey Potter to descend  into this sort of state, it was felt by the treatment team that it would  be in the best of interest of Lindsey Potter in the [place] run  to be set up in  some form of an assisted-living facility.  Lindsey Potter was somewhat  resistant to this, wanting to go back to her own apartment, but her  outside case manager and legal guardian were in agreement with this [place]  about the wisdom of this recommendation.  This is ultimately what was  arranged for Lindsey Potter.   Her hospital course was marked by a period during which she was refusing  her usual psychotropic medications, which included Seroquel, Zoloft,  Cogentin, and diazepam.  With this, she decompensated further, became  more symptomatic, or delusional, more paranoid, anxious, and virtually  unable to participate in therapeutic groups and activities.  Her oral  intake decreased as well.  However, through the diligent efforts of our  nursing staff, she was eventually persuaded to start taking her  medication again.  When she did, she began to respond  in a way that was  visible and fairly immediate.  She began eating more normally.  However,  because of her repeated history of medication noncompliance and refusal,  it was anticipated that eventually, it would benefit Lindsey Potter to be  prescribed a Rihn-acting injectable antipsychotic medication.  Although  this was not initiated during her stay here, she was begun on a regimen  of Prolixin, in anticipation of later Prolixin Decanoate therapy.   The patient was discharged after approximately 3 weeks of inpatient  hospitalization.  She was ultimately discharged to [facility]  group home.   AFTERCARE:  As  above.   DISCHARGE MEDICATIONS:  1. Seroquel 200 mg q.h.s.  2. Crestor 20 mg q.h.s.  3. Zoloft 50 mg daily.  4. Neurontin 300 mg t.i.d.  5. Benadryl 25 mg t.i.d.  6. Cogentin 1 mg t.i.d.  7. Prolixin 10 mg t.i.d.  8. Diazepam 10 mg t.i.d..   DISCHARGE DIAGNOSES:  AXIS I:  Schizophrenia, chronic paranoid type,  acute exacerbation, resolving.  AXIS II: Deferred.  AXIS III: History of hyperlipidemia, chronic pain.  AXIS IV: Stressors severe.  AXIS V: GAF on discharge.      Anselm Jungling, MD  Electronically Signed     SPB/MEDQ  D:  12/09/2007  T:  12/09/2007  Job:  [id number]
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
## 2010-10-13 NOTE — Discharge Summary (Signed)
NAME:  Lindsey Potter, Lindsey Potter                  ACCOUNT NO.:  [id number]   MEDICAL RECORD NO.:  [id number]          PATIENT TYPE:  IPS   LOCATION:  0405                          FACILITY:  [facility]   PHYSICIAN:  Anselm Jungling, MD  DATE OF BIRTH:  04/20/1951   DATE OF ADMISSION:  10/29/2005  DATE OF DISCHARGE:  11/04/2005                                 DISCHARGE SUMMARY   IDENTIFYING DATA AND REASON FOR ADMISSION:  This was the first [facility] admission  for Lindsey Potter, a 60 year old single white female with a history of  schizophrenia.  She came to [place] as a patient of Dr. Hortencia Pilar at the [facility] mental health.  She indicated that she had recently been placed back  on her antipsychotic Navane for 6 weeks, but that she was experiencing  restlessness from it and disliked  this.  She reported that she had been  increasingly obsessive, anxious and delusional.  It was suspected that she  had probably been taking less of the Navane than prescribed, or perhaps not  at all, due to her experiencing side effects.  Please refer to the admission  note for further details pertaining to the symptoms, circumstances and  history that led to her hospitalization.  She was given an initial Axis I  diagnosis of schizophrenia, chronic paranoid type, with acute exacerbation.   MEDICAL AND LABORATORY:  The patient was medically and physically assessed  both at [facility], and by her own psychiatric nurse practitioner  upon admission.  The patient had a history of hyperlipidemia, and for this  was given Lipitor 40 mg daily, and aspirin 81 mg daily.  There were no acute  medical issues during this brief inpatient psychiatric stay.  Routine  admission laboratory was within normal limits.   HOSPITAL COURSE:  The patient presented as a tall, thin female who was well  groomed.  She was quite anxious, and repeatedly complained about muscle  cramps in her legs, and sought reassurance repeatedly, within several  minutes,  that it would get better.  She was so focused on this, she was not  really able to give a complete history.  She did not make any overtly  delusional statements in the initial interview, but appeared quite  disturbed.  Her mood was depressed, anxious, and her affect was blunted.   The following day, the patient remained extremely anxious, and obsessively  preoccupied about tingling sensation on the sole of her right foot.  She  asked me many times if I thought that this represented neuropathy.  And  will it go away, again asked of me several times within a few minutes.  She continued to follow me around the unit anxiously asking questions  repeatedly.  She had not slept well.   The patient had been prescribed Navane prior to admission.  Because of the  comments that she made about Navane she was not continued on it.  However, a  trial of Risperdal 1 mg in the morning was given on a trial basis.  Zoloft  was increased to 50 mg  daily, after initially tolerating a dose of 25 mg  daily.   The patient initially appeared to have a good response to Risperdal, with  much less in the way of obsessional anxiety, but on the following day, the  third day of the Risperdal trial, she appeared to revert back to her initial  presentation.  Because of this her discharge was postponed, and Risperdal  was increased to 1 mg t.i.d..  Following this, the patient improved  significantly.  She became brighter, less anxious, less obsessional, and  indicated on the seventh hospital day that she wanted to go home.  She was  felt to be appropriate and safe for discharge.   AFTERCARE:  The patient was to follow-up with Dr. Hortencia Pilar at [facility] mental health on November 05, 2005, the day following discharge.   DISCHARGE MEDICATIONS:  Lipitor 40 mg daily, aspirin 81 mg daily, Zoloft 50  mg daily, Neurontin 300 mg q.h.s., Risperdal 1 mg t.i.d., Navane 2 mg  q.h.s., and Cogentin 1 mg q.h.s..   DISCHARGE  DIAGNOSES:  AXIS I: Obsessive-compulsive disorder, severe, and  generalized anxiety disorder.  AXIS II: Deferred  AXIS III: Hyperlipidemia.  AXIS IV: Stressors severe.  AXIS V: Global assessment of functioning on discharge 60.           ______________________________  Anselm Jungling, MD  Electronically Signed     SPB/MEDQ  D:  11/14/2005  T:  11/15/2005  Job:  [id number]

## 2011-01-31 ENCOUNTER — Telehealth: Payer: Self-pay | Admitting: Family Medicine

## 2011-01-31 NOTE — Telephone Encounter (Signed)
Called pt and left message 'happy to hear from you. Congratulations! i will make sure, Claire saxon will get your message' .Arlyss Repress

## 2011-01-31 NOTE — Telephone Encounter (Signed)
Lindsey Potter is calling because she wanted to let Lindsey Potter know that she is doing well and she is living independently in her own apartment in [place].  If Lindsey Potter gets a chance, she would like her to call back.

## 2011-02-02 NOTE — Telephone Encounter (Signed)
noted 

## 2011-02-21 LAB — POCT I-STAT, CHEM 8
BUN: 16
Calcium, Ion: 1.17
Creatinine, Ser: 0.9
Potassium: 3.4 — ABNORMAL LOW
TCO2: 23

## 2011-02-21 LAB — POCT CARDIAC MARKERS: Troponin i, poc: <0.05

## 2011-02-21 LAB — DIFFERENTIAL
Eosinophils Absolute: 0.1
Eosinophils Relative: 1
Lymphs Abs: 2
Monocytes Absolute: 0.5
Monocytes Relative: 6
Neutrophils Relative %: 67

## 2011-02-21 LAB — CBC
HCT: 43.8
Hemoglobin: 14.6
Platelets: 315
RBC: 4.66

## 2011-02-22 LAB — URINALYSIS, ROUTINE W REFLEX MICROSCOPIC
Bilirubin Urine: NEGATIVE
Glucose, UA: NEGATIVE
Glucose, UA: 500 — AB
Hgb urine dipstick: NEGATIVE
Hgb urine dipstick: NEGATIVE
Hgb urine dipstick: NEGATIVE
Ketones, ur: 15 — AB
Nitrite: NEGATIVE
Protein, ur: NEGATIVE
Urobilinogen, UA: 0.2
pH: 5
pH: 7
pH: 6

## 2011-02-22 LAB — HEPATIC FUNCTION PANEL
ALT: 44 — ABNORMAL HIGH
AST: 147 — ABNORMAL HIGH
Albumin: 3 — ABNORMAL LOW
Bilirubin, Direct: 0.1
Bilirubin, Direct: <0.1
Total Bilirubin: 0.7
Total Protein: 6.7

## 2011-02-22 LAB — CBC
HCT: 42.8
Hemoglobin: 14.4
Hemoglobin: 15
MCHC: 34.7
MCHC: 33.7
MCV: 92.9
MCV: 93.3
Platelets: 323
RBC: 4.95
RBC: 4.63
RBC: 4.76
WBC: 11.3 — ABNORMAL HIGH
WBC: 9.4

## 2011-02-22 LAB — HEMOGLOBIN A1C
Hgb A1c MFr Bld: 5.6
Hgb A1c MFr Bld: 5.5
Mean Plasma Glucose: 119
Mean Plasma Glucose: 122
Mean Plasma Glucose: 119

## 2011-02-22 LAB — HEPATITIS PANEL, ACUTE
HCV Ab: NEGATIVE
Hep B C IgM: NEGATIVE
Hepatitis B Surface Ag: NEGATIVE

## 2011-02-22 LAB — DIFFERENTIAL
Eosinophils Relative: 0
Eosinophils Relative: 0
Lymphocytes Relative: 10 — ABNORMAL LOW
Lymphocytes Relative: 10 — ABNORMAL LOW
Lymphs Abs: 1.1
Lymphs Abs: 0.9
Monocytes Absolute: 0.6
Monocytes Absolute: 0.7
Monocytes Relative: 5
Monocytes Relative: 7
Neutro Abs: 9.5 — ABNORMAL HIGH
Neutrophils Relative %: 84 — ABNORMAL HIGH

## 2011-02-22 LAB — VITAMIN B12: Vitamin B-12: 255 (ref 211–911)

## 2011-02-22 LAB — RAPID URINE DRUG SCREEN, HOSP PERFORMED
Cocaine: NOT DETECTED
Opiates: NOT DETECTED
Tetrahydrocannabinol: NOT DETECTED

## 2011-02-22 LAB — URINE CULTURE

## 2011-02-22 LAB — WET PREP, GENITAL
Clue Cells Wet Prep HPF POC: NONE SEEN
Trich, Wet Prep: NONE SEEN

## 2011-02-22 LAB — BASIC METABOLIC PANEL
BUN: 16
CO2: 20
Chloride: 102
GFR calc Af Amer: >60
GFR calc non Af Amer: >60
GFR calc non Af Amer: >60
Glucose, Bld: 105 — ABNORMAL HIGH
Potassium: 4.2
Potassium: 3.3 — ABNORMAL LOW
Sodium: 137

## 2011-02-22 LAB — RPR: RPR Ser Ql: NONREACTIVE

## 2011-02-22 LAB — LIPID PANEL
HDL: 42
Triglycerides: 112
VLDL: 22

## 2011-02-22 LAB — COMPREHENSIVE METABOLIC PANEL
CO2: 26
Calcium: 9.2
Creatinine, Ser: 1.02
GFR calc Af Amer: >60
GFR calc non Af Amer: 56 — ABNORMAL LOW
Glucose, Bld: 204 — ABNORMAL HIGH

## 2011-02-22 LAB — FOLATE RBC: RBC Folate: 677 — ABNORMAL HIGH

## 2011-02-22 LAB — ETHANOL
Alcohol, Ethyl (B): 6
Alcohol, Ethyl (B): <5

## 2011-02-22 LAB — HERPES SIMPLEX VIRUS CULTURE: Culture: NOT DETECTED

## 2011-02-22 LAB — TSH
TSH: 3.467
TSH: 7.139 — ABNORMAL HIGH

## 2011-02-22 LAB — METHYLMALONIC ACID, SERUM: Methylmalonic Acid, Quantitative: 208 nmol/L (ref 87–318)

## 2011-02-22 LAB — URINE MICROSCOPIC-ADD ON

## 2011-02-22 LAB — POCT PREGNANCY, URINE: Operator id: 29452

## 2011-02-22 LAB — GC/CHLAMYDIA PROBE AMP, GENITAL: GC Probe Amp, Genital: NEGATIVE

## 2011-03-14 LAB — URINALYSIS, ROUTINE W REFLEX MICROSCOPIC
Protein, ur: NEGATIVE
Urobilinogen, UA: 0.2

## 2011-03-14 LAB — RAPID URINE DRUG SCREEN, HOSP PERFORMED: Barbiturates: NOT DETECTED

## 2015-12-12 ENCOUNTER — Encounter: Payer: Self-pay | Admitting: Vascular Surgery

## 2015-12-13 ENCOUNTER — Other Ambulatory Visit: Payer: Self-pay | Admitting: *Deleted

## 2015-12-13 DIAGNOSIS — I83893 Varicose veins of bilateral lower extremities with other complications: Secondary | ICD-10-CM

## 2015-12-29 ENCOUNTER — Encounter: Payer: Self-pay | Admitting: Vascular Surgery

## 2016-01-03 ENCOUNTER — Ambulatory Visit (INDEPENDENT_AMBULATORY_CARE_PROVIDER_SITE_OTHER): Payer: Medicare HMO | Admitting: Vascular Surgery

## 2016-01-03 ENCOUNTER — Encounter: Payer: Self-pay | Admitting: Vascular Surgery

## 2016-01-03 VITALS — BP 116/80 | HR 92 | Resp 18 | Ht 69.0 in | Wt 198.1 lb

## 2016-01-03 DIAGNOSIS — I83893 Varicose veins of bilateral lower extremities with other complications: Secondary | ICD-10-CM | POA: Diagnosis not present

## 2016-01-03 DIAGNOSIS — I83892 Varicose veins of left lower extremities with other complications: Secondary | ICD-10-CM | POA: Insufficient documentation

## 2016-01-03 NOTE — Progress Notes (Signed)
Subjective:     Patient ID: Lindsey Potter, female   DOB: 1950/06/02, 65 y.o.   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 Patient has a remote history of vein stripping in the right leg 15-20 years ago. She also had laser ablation of presumably the great saphenous vein in the right leg in [place] by Dr. Angelina Ok 2 years ago. She fell a few weeks following the procedure. She eventually developed recurrent varicosities in the right calf. She has had chronic edema in the right leg and is now developing painful varicosities in the left leg in the thigh and calf area. She has no history of DVT thrombophlebitis stasis ulcers or bleeding in either leg. She has had some numbness in the feet presumably due to neuropathy. She does not have PAD by previous studies.  Past Medical History:  Diagnosis Date  . Anxiety   . Depression   . Hyperlipidemia   . Hypertension   . Hypothyroidism     Social History  Substance Use Topics  . Smoking status: Never Smoker  . Smokeless tobacco: Never Used  . Alcohol use No    No family history on file.  Allergies  Allergen Reactions  . Penicillins     REACTION: rash     Current Outpatient Prescriptions:  .  benztropine (COGENTIN) 2 MG tablet, Take 2 mg by mouth 2 (two) times daily., Disp: , Rfl:  .  Cyanocobalamin (VITAMIN B 12 PO), Take 1,000 mcg by mouth., Disp: , Rfl:  .  gabapentin (NEURONTIN) 300 MG capsule, Take 300 mg by mouth 3 (three) times daily., Disp: , Rfl:  .  levothyroxine (SYNTHROID, LEVOTHROID) 50 MCG tablet, Take 50 mcg by mouth daily before breakfast., Disp: , Rfl:  .  omeprazole (PRILOSEC) 20 MG capsule, Take 20 mg by mouth daily., Disp: , Rfl:  .  QUEtiapine (SEROQUEL) 200 MG tablet, Take 400 mg by mouth at bedtime. , Disp: , Rfl:  .  rosuvastatin (CRESTOR) 20 MG tablet, Take 20 mg by mouth daily., Disp: , Rfl:  .  sertraline (ZOLOFT)  100 MG tablet, Take 100 mg by mouth daily., Disp: , Rfl:  .  Vitamin D, Ergocalciferol, (DRISDOL) 50000 units CAPS capsule, Take 50,000 Units by mouth 2 (two) times a week., Disp: , Rfl:  .  atorvastatin (LIPITOR) 40 MG tablet, Take 40 mg by mouth at bedtime.  , Disp: , Rfl:  .  diphenhydrAMINE (BENADRYL) 25 mg capsule, Take 1 to 2 tablets by mouth at bedtime for sleep and itching , Disp: , Rfl:  .  LORazepam (ATIVAN) 0.5 MG tablet, Take 0.5 mg by mouth at bedtime.  , Disp: , Rfl:   Vitals:   01/03/16 1058  BP: 116/80  Pulse: 92  Resp: 18  SpO2: 97%  Weight: 198 lb 1.6 oz (89.9 kg)  Height:  (1.753 m)    Body mass index is 29.25 kg/m.          Review of Systems denies chest pain, dyspnea on exertion, PND, orthopnea. Does have history of hypertension, depression, and neuropathy.     Objective:   Physical Exam BP 116/80 (BP Location: Left Arm, Patient Position: Sitting, Cuff Size: Normal)   Pulse 92   Resp 18   Ht  (1.753 m)   Wt 198 lb 1.6 oz (89.9 kg)   SpO2 97%   BMI 29.25 kg/m  Gen.-alert and oriented x3 in no apparent distress HEENT normal for age Lungs no rhonchi or wheezing Cardiovascular regular rhythm no murmurs carotid pulses 3+ palpable no bruits audible Abdomen soft nontender no palpable masses Musculoskeletal free of  major deformities Skin clear -no rashes Neurologic normal Lower extremities 3+ femoral and dorsalis pedis pulses palpable bilaterally with 1+ edema bilaterally Right leg with multiple bulging varicosities in the medial calf. No hyperpigmentation or ulceration noted. Left leg with bulging varicosities proximal third of the thigh medially as well as medial calf below the knee. No active ulcer.  Today I performed a bedside ultrasound exam-sono site. The right great saphenous vein appears absent from previous ablation There is gross reflux in the distal left great saphenous vein       Assessment:     Bilateral painful  varicosities with history of previous vein stripping right leg 15 years ago plus laser [place] right great [place]ous [place] Sondra Comeruz Patient has not had formal venous ultrasound study and appears to have absent right great saphenous vein but gross reflux and distal left great saphenous vein on bedside ultrasound exam    Plan:     Patient to get formal venous reflux study next week and return to see me in 3-4 weeks for recommendations

## 2016-01-11 ENCOUNTER — Ambulatory Visit (HOSPITAL_COMMUNITY)
Admission: RE | Admit: 2016-01-11 | Discharge: 2016-01-11 | Disposition: A | Discharge status: Home / Self Care | Payer: Medicare HMO | Source: Ambulatory Visit | Attending: Vascular Surgery | Admitting: Vascular Surgery

## 2016-01-11 DIAGNOSIS — E039 Hypothyroidism, unspecified: Secondary | ICD-10-CM | POA: Insufficient documentation

## 2016-01-11 DIAGNOSIS — E785 Hyperlipidemia, unspecified: Secondary | ICD-10-CM | POA: Diagnosis not present

## 2016-01-11 DIAGNOSIS — I83893 Varicose veins of bilateral lower extremities with other complications: Secondary | ICD-10-CM

## 2016-01-11 DIAGNOSIS — I10 Essential (primary) hypertension: Secondary | ICD-10-CM | POA: Insufficient documentation

## 2016-01-11 DIAGNOSIS — F329 Major depressive disorder, single episode, unspecified: Secondary | ICD-10-CM | POA: Insufficient documentation

## 2016-01-11 DIAGNOSIS — F419 Anxiety disorder, unspecified: Secondary | ICD-10-CM | POA: Diagnosis not present

## 2016-01-11 DIAGNOSIS — R609 Edema, unspecified: Secondary | ICD-10-CM | POA: Diagnosis present

## 2016-01-20 ENCOUNTER — Encounter: Payer: Self-pay | Admitting: Vascular Surgery

## 2016-01-24 ENCOUNTER — Ambulatory Visit (INDEPENDENT_AMBULATORY_CARE_PROVIDER_SITE_OTHER): Payer: Medicare HMO | Admitting: Vascular Surgery

## 2016-01-24 ENCOUNTER — Encounter: Payer: Self-pay | Admitting: Vascular Surgery

## 2016-01-24 VITALS — BP 123/78 | HR 96 | Temp 97.6°F | Resp 24 | Ht 67.5 in | Wt 200.0 lb

## 2016-01-24 DIAGNOSIS — I83893 Varicose veins of bilateral lower extremities with other complications: Secondary | ICD-10-CM

## 2016-01-24 NOTE — Progress Notes (Signed)
Subjective:     Patient ID: Lindsey HawthorneSusan Hijazi, female   DOB: 11-Jul-1950, 65 y.o.   MRN: [id number]  HPI This 65 year old female returns for further discussion regarding her painful varicosities in both lower extremities. I saw her on 01/03/2016 and she returned for a bilateral lower extremity reflux study on August 16 which I have reviewed. Patient has a history of previous right leg great saphenous vein stripping 15-20 years ago and also had laser ablation of the great saphenous vein in the right leg 2 years ago [place] Dr[place] [place]. Patient has no procedures in the past on her left leg and does have painful varicosities. These cause aching throbbing and burning discomfort. She has begun wearing Reffett leg elastic compression stockings 20-30 millimeter gradient and trying elevation and ibuprofen. No success yet.  Past Medical History:  Diagnosis Date  . Anxiety   . Depression   . Hyperlipidemia   . Hypertension   . Hypothyroidism     Social History  Substance Use Topics  . Smoking status: Never Smoker  . Smokeless tobacco: Never Used  . Alcohol use No    History reviewed. No pertinent family history.  Allergies  Allergen Reactions  . Penicillins     REACTION: rash     Current Outpatient Prescriptions:  .  atorvastatin (LIPITOR) 40 MG tablet, Take 20 mg by mouth at bedtime. , Disp: , Rfl:  .  benztropine (COGENTIN) 2 MG tablet, Take 2 mg by mouth 2 (two) times daily., Disp: , Rfl:  .  Cyanocobalamin (VITAMIN B 12 PO), Take 1,000 mcg by mouth., Disp: , Rfl:  .  diphenhydrAMINE (BENADRYL) 25 mg capsule, Take 1 to 2 tablets by mouth at bedtime for sleep and itching , Disp: , Rfl:  .  gabapentin (NEURONTIN) 300 MG capsule, Take 100 mg by mouth 2 (two) times daily. , Disp: , Rfl:  .  levothyroxine (SYNTHROID, LEVOTHROID) 50 MCG tablet, Take 50 mcg by mouth daily before breakfast., Disp: , Rfl:  .  omeprazole (PRILOSEC) 20 MG capsule, Take 20 mg by mouth daily., Disp: , Rfl:  .   QUEtiapine (SEROQUEL) 200 MG tablet, Take 400 mg by mouth at bedtime. , Disp: , Rfl:  .  rosuvastatin (CRESTOR) 20 MG tablet, Take 20 mg by mouth daily., Disp: , Rfl:  .  sertraline (ZOLOFT) 100 MG tablet, Take 100 mg by mouth daily., Disp: , Rfl:  .  Vitamin D, Ergocalciferol, (DRISDOL) 50000 units CAPS capsule, Take 50,000 Units by mouth 2 (two) times a week., Disp: , Rfl:  .  LORazepam (ATIVAN) 0.5 MG tablet, Take 0.5 mg by mouth at bedtime.  , Disp: , Rfl:   Vitals:   01/24/16 1134  BP: 123/78  Pulse: 96  Resp: (!) 24  Temp: 97.6 F (36.4 C)  SpO2: 100%  Weight: 200 lb (90.7 kg)  Height: 5' 7.5" (1.715 m)    Body mass index is 30.86 kg/m.         Review of Systems Denies chest pain, dyspnea on exertion, PND, orthopnea, hemoptysis    Objective:   Physical Exam BP 123/78   Pulse 96   Temp 97.6 F (36.4 C)   Resp (!) 24   Ht 5' 7.5" (1.715 m)   Wt 200 lb (90.7 kg)   SpO2 100%   BMI 30.86 kg/m   Gen. well-developed well-nourished female no apparent distress alert and oriented 3 Lungs no rhonchi or wheezing Right leg with extensive bulging varicosities in  the medial calf and posteriorly Left leg with extensive varicosities in the proximal medial thigh and lateral thigh around the knee into the lateral calf and medial calf.  Formal venous duplex exam performed on 01/11/2016 revealed gross reflux in left great saphenous vein supplying these painful varicosities. The right great saphenous vein is absent in the right small saphenous vein has no reflux  I also performed a bedside sono site ultrasound exam today and confirm the findings on the left leg. The great saphenous vein in the distal thigh and proximal calf is large caliber with rapid reflux     Assessment:     Bilateral painful varicosities with history of right great saphenous vein stripping and laser ablation right great saphenous vein in the past at other institutions Patient is having symptoms which are  affecting her daily living    Plan:         #1 Apostol leg elastic compression stockings 20-30 mm gradient #2 elevate legs as much as possible #3 ibuprofen daily on a regular basis for pain #4 return in 3 months-if no significant improvement then she should have #1 laser ablation left great saphenous vein followed by #2 multiple stab phlebectomy-greater than 20 of painful varicosities in right leg She should then return in 3 months to be evaluated for stab phlebectomy of painful varicosities in left leg

## 2016-04-04 ENCOUNTER — Encounter: Payer: Self-pay | Admitting: Vascular Surgery

## 2016-04-06 ENCOUNTER — Encounter: Payer: Self-pay | Admitting: Vascular Surgery

## 2016-04-10 ENCOUNTER — Encounter: Payer: Self-pay | Admitting: Vascular Surgery

## 2016-04-10 ENCOUNTER — Ambulatory Visit (INDEPENDENT_AMBULATORY_CARE_PROVIDER_SITE_OTHER): Payer: Medicare HMO | Admitting: Vascular Surgery

## 2016-04-10 VITALS — BP 126/92 | HR 150 | Temp 97.0°F | Resp 14 | Ht 67.0 in | Wt 202.0 lb

## 2016-04-10 DIAGNOSIS — I83893 Varicose veins of bilateral lower extremities with other complications: Secondary | ICD-10-CM | POA: Diagnosis not present

## 2016-04-10 NOTE — Progress Notes (Signed)
Subjective:     Patient ID: Lindsey HawthorneSusan Potter, female   DOB: 09-Nov-1950, 65 y.o.   MRN: [id number]  HPI This 65 year old female returns for follow-up regarding her painful varicosities in both lower extremities. She has tried Jeansonne-leg elastic compression stockings 20-30 millimeter gradient as well as elevation and ibuprofen continues to have aching throbbing and burning discomfort as well as edema in both legs is day progresses. She has a remote history of right great saphenous vein stripping and laser ablation performed in [place] on the right side but is had no procedures on the left. Her symptoms are affecting her daily living.  Past Medical History:  Diagnosis Date  . Anxiety   . Depression   . Hyperlipidemia   . Hypertension   . Hypothyroidism     Social History  Substance Use Topics  . Smoking status: Never Smoker  . Smokeless tobacco: Never Used  . Alcohol use No    No family history on file.  Allergies  Allergen Reactions  . Penicillins     REACTION: rash     Current Outpatient Prescriptions:  .  atorvastatin (LIPITOR) 40 MG tablet, Take 20 mg by mouth at bedtime. , Disp: , Rfl:  .  benztropine (COGENTIN) 2 MG tablet, Take 2 mg by mouth 2 (two) times daily., Disp: , Rfl:  .  Cyanocobalamin (VITAMIN B 12 PO), Take 1,000 mcg by mouth., Disp: , Rfl:  .  diphenhydrAMINE (BENADRYL) 25 mg capsule, Take 1 to 2 tablets by mouth at bedtime for sleep and itching , Disp: , Rfl:  .  gabapentin (NEURONTIN) 100 MG capsule, , Disp: , Rfl:  .  gabapentin (NEURONTIN) 300 MG capsule, Take 100 mg by mouth 2 (two) times daily. , Disp: , Rfl:  .  levothyroxine (SYNTHROID, LEVOTHROID) 50 MCG tablet, Take 50 mcg by mouth daily before breakfast., Disp: , Rfl:  .  LORazepam (ATIVAN) 0.5 MG tablet, Take 0.5 mg by mouth at bedtime.  , Disp: , Rfl:  .  omeprazole (PRILOSEC) 20 MG capsule, Take 20 mg by mouth daily., Disp: , Rfl:  .  QUEtiapine (SEROQUEL XR) 400 MG 24 hr tablet, , Disp: , Rfl:  .   QUEtiapine (SEROQUEL) 200 MG tablet, Take 400 mg by mouth at bedtime. , Disp: , Rfl:  .  rosuvastatin (CRESTOR) 20 MG tablet, Take 20 mg by mouth daily., Disp: , Rfl:  .  sertraline (ZOLOFT) 100 MG tablet, Take 100 mg by mouth daily., Disp: , Rfl:  .  Vitamin D, Ergocalciferol, (DRISDOL) 50000 units CAPS capsule, Take 50,000 Units by mouth 2 (two) times a week., Disp: , Rfl:   Vitals:   04/10/16 1030  BP: (!) 126/92  Pulse: (!) 150  Resp: 14  Temp: 97 F (36.1 C)  SpO2: 91%  Weight: 202 lb (91.6 kg)  Height: 5\' 7"  (1.702 m)    Body mass index is 31.64 kg/m.         Review of Systems Denies chest pain, dyspnea on exertion, PND, orthopnea, hemoptysis    Objective:   Physical Exam BP (!) 126/92 (BP Location: Left Arm, Patient Position: Sitting, Cuff Size: Normal)   Pulse (!) 150   Temp 97 F (36.1 C)   Resp 14   Ht 5\' 7"  (1.702 m)   Wt 202 lb (91.6 kg)   SpO2 91%   BMI 31.64 kg/m   Gen. 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

## 2016-04-25 ENCOUNTER — Other Ambulatory Visit: Payer: Self-pay | Admitting: *Deleted

## 2016-04-25 DIAGNOSIS — I83893 Varicose veins of bilateral lower extremities with other complications: Secondary | ICD-10-CM

## 2016-05-17 ENCOUNTER — Encounter: Payer: Self-pay | Admitting: Vascular Surgery

## 2016-05-18 ENCOUNTER — Encounter: Payer: Self-pay | Admitting: Vascular Surgery

## 2016-05-29 ENCOUNTER — Encounter: Payer: Self-pay | Admitting: Vascular Surgery

## 2016-05-29 ENCOUNTER — Ambulatory Visit (INDEPENDENT_AMBULATORY_CARE_PROVIDER_SITE_OTHER): Payer: Medicare HMO | Admitting: Vascular Surgery

## 2016-05-29 VITALS — BP 125/78 | HR 101 | Temp 98.0°F | Resp 18 | Ht 67.5 in | Wt 200.0 lb

## 2016-05-29 DIAGNOSIS — I83892 Varicose veins of left lower extremities with other complications: Secondary | ICD-10-CM

## 2016-05-29 NOTE — Progress Notes (Signed)
Laser Ablation Procedure    Date: 05/29/2016   Lindsey HawthorneSusan Potter DOB:[birth year]  Consent signed: Yes    Surgeon:  Dr. Quita SkyeJames D. Hart RochesterLawson  Procedure: Laser Ablation: left Greater Saphenous Vein  BP 125/78   Pulse (!) 101   Temp 98 F (36.7 C)   Resp 18   Ht 5' 7.5" (1.715 m)   Wt 200 lb (90.7 kg)   SpO2 99%   BMI 30.86 kg/m   Tumescent Anesthesia: 300 cc 0.9% NaCl with 50 cc Lidocaine HCL with 1% Epi and 15 cc 8.4% NaHCO3  Local Anesthesia: 5 cc Lidocaine HCL and NaHCO3 (ratio 2:1)  Pulsed Mode: 15 watts, 500ms delay, 1.0 duration  Total Energy:  1407            Total Pulses:  95              Total Time: 1:34    Patient tolerated procedure well  Notes:   Description of Procedure:  After marking the course of the secondary varicosities, the patient was placed on the operating table in the supine position, and the left leg was prepped and draped in sterile fashion.   Local anesthetic was administered and under ultrasound guidance the saphenous vein was accessed with a micro needle and guide wire; then the mirco puncture sheath was placed.  A guide wire was inserted saphenofemoral junction , followed by a 5 french sheath.  The position of the sheath and then the laser fiber below the junction was confirmed using the ultrasound.  Tumescent anesthesia was administered along the course of the saphenous vein using ultrasound guidance. The patient was placed in Trendelenburg position and protective laser glasses were placed on patient and staff, and the laser was fired at 15 watts continuous mode advancing 1-782mm/second for a total of 1407 joules.     Steri strips were applied to the stab wounds and ABD pads and thigh high compression stockings were applied.  Ace wrap bandages were applied over the phlebectomy sites and at the top of the saphenofemoral junction. Blood loss was less than 15 cc.  The patient ambulated out of the operating room having tolerated the procedure well.

## 2016-05-29 NOTE — Progress Notes (Signed)
Subjective:     Patient ID: Lindsey HawthorneSusan Potter, female   DOB: [birth year], 66 y.o.   MRN: [id number]  HPI This 66 year old female had laser ablation of the left great saphenous vein from the proximal calf to the proximal thigh. The guidewire would not traverse the great saphenous vein up to the saphenofemoral junction. There was a large caliber vein from the proximal calf to the proximal thigh. This was performed under local tumescent anesthesia. A total of 1470 J of energy was utilized. She tolerated the procedure well.  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

## 2016-06-05 ENCOUNTER — Encounter (HOSPITAL_COMMUNITY): Payer: Medicare HMO

## 2016-06-05 ENCOUNTER — Ambulatory Visit: Payer: Medicare HMO | Admitting: Vascular Surgery

## 2016-06-06 ENCOUNTER — Ambulatory Visit (INDEPENDENT_AMBULATORY_CARE_PROVIDER_SITE_OTHER): Payer: Medicare HMO | Admitting: Vascular Surgery

## 2016-06-06 ENCOUNTER — Ambulatory Visit (HOSPITAL_COMMUNITY)
Admission: RE | Admit: 2016-06-06 | Discharge: 2016-06-06 | Disposition: A | Discharge status: Home / Self Care | Payer: Medicare HMO | Source: Ambulatory Visit | Attending: Vascular Surgery | Admitting: Vascular Surgery

## 2016-06-06 ENCOUNTER — Encounter: Payer: Self-pay | Admitting: Vascular Surgery

## 2016-06-06 VITALS — BP 101/71 | HR 94 | Temp 98.0°F | Resp 20 | Ht 67.5 in | Wt 203.0 lb

## 2016-06-06 DIAGNOSIS — I83893 Varicose veins of bilateral lower extremities with other complications: Secondary | ICD-10-CM

## 2016-06-06 NOTE — Progress Notes (Signed)
Vascular and Vein Specialist of [facility]  Patient name: Lindsey Potter MRN: [id number] DOB: [birth year] Sex: female  REASON FOR VISIT: Follow-up after laser ablation of her left great saphenous vein with Dr. Hart Rochester on 05/29/2016  HPI: Lindsey Potter is a 66 y.o. female here for follow-up. She reports minimal discomfort associated with the ablation.  Past Medical History:  Diagnosis Date  . Anxiety   . Depression   . Hyperlipidemia   . Hypertension   . Hypothyroidism     No family history on file.  SOCIAL HISTORY: Social History  Substance Use Topics  . Smoking status: Never Smoker  . Smokeless tobacco: Never Used  . Alcohol use No    Allergies  Allergen Reactions  . Demerol [Meperidine] Swelling  . Penicillins     REACTION: rash  . Prilosec Otc [Omeprazole Magnesium] Other (See Comments)    Stomach pain.  Generic form of the pill works better.     Current Outpatient Prescriptions  Medication Sig Dispense Refill  . atorvastatin (LIPITOR) 40 MG tablet Take 20 mg by mouth at bedtime.     . benztropine (COGENTIN) 2 MG tablet Take 2 mg by mouth 2 (two) times daily.    . Cyanocobalamin (VITAMIN B 12 PO) Take 1,000 mcg by mouth.    . diphenhydrAMINE (BENADRYL) 25 mg capsule Take 1 to 2 tablets by mouth at bedtime for sleep and itching     . gabapentin (NEURONTIN) 100 MG capsule     . gabapentin (NEURONTIN) 300 MG capsule Take 100 mg by mouth 2 (two) times daily.     Marland Kitchen levothyroxine (SYNTHROID, LEVOTHROID) 50 MCG tablet Take 50 mcg by mouth daily before breakfast.    . LORazepam (ATIVAN) 0.5 MG tablet Take 0.5 mg by mouth at bedtime.      Marland Kitchen omeprazole (PRILOSEC) 20 MG capsule Take 20 mg by mouth daily.    . QUEtiapine (SEROQUEL XR) 400 MG 24 hr tablet     . QUEtiapine (SEROQUEL) 200 MG tablet Take 400 mg by mouth at bedtime.     . rosuvastatin (CRESTOR) 20 MG tablet Take 20 mg by mouth daily.    . sertraline (ZOLOFT) 100 MG tablet Take 100 mg  by mouth daily.    . Vitamin D, Ergocalciferol, (DRISDOL) 50000 units CAPS capsule Take 50,000 Units by mouth 2 (two) times a week.     No current facility-administered medications for this visit.     REVIEW OF SYSTEMS:  [X]  denotes positive finding, [ ]  denotes negative finding Cardiac  Comments:  Chest pain or chest pressure:     Shortness of breath upon exertion:    Short of breath when lying flat:    Irregular heart rhythm:        Vascular    Pain in calf, thigh, or hip brought on by ambulation:    Pain in feet at night that wakes you up from your sleep:     Blood clot in your veins:    Leg swelling:           PHYSICAL EXAM: Vitals:   06/06/16 1510  BP: 101/71  Pulse: 94  Resp: 20  Temp: 98 F (36.7 C)  TempSrc: Oral  SpO2: 98%  Weight: 203 lb (92.1 kg)  Height: 5' 7.5" (1.715 m)    GENERAL: The patient is a well-nourished female, in no acute distress. The vital signs are documented above. CARDIOVASCULAR: Palpable dorsalis pedis pulses bilaterally. PULMONARY: There is good air exchange  MUSCULOSKELETAL: There are no major deformities or cyanosis. NEUROLOGIC: No focal weakness or paresthesias are detected. SKIN: There are no ulcers or rashes noted. Minimal bruising and no irritation from the ablation PSYCHIATRIC: The patient has a normal affect.  DATA:  Duplex today reveals closure of her great saphenous vein from the insertion site at mid calf to approximate 5 inches from the saphenofemoral junction and no T  MEDICAL ISSUES: Reviewed the follow-up study with the patient and reassured her that there is been no difficulty regarding eye DVT. 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 We will coordinate this at her earliest convenience    Larina Earthlyodd F. Lekendrick Alpern, MD [facility] Office Tel [phone] Pager [phone]

## 2016-06-07 ENCOUNTER — Encounter: Payer: Self-pay | Admitting: Vascular Surgery

## 2016-06-11 ENCOUNTER — Ambulatory Visit: Payer: Medicare HMO | Admitting: Vascular Surgery

## 2016-06-12 ENCOUNTER — Ambulatory Visit (INDEPENDENT_AMBULATORY_CARE_PROVIDER_SITE_OTHER): Payer: Medicare HMO | Admitting: Vascular Surgery

## 2016-06-12 ENCOUNTER — Encounter: Payer: Self-pay | Admitting: Vascular Surgery

## 2016-06-12 VITALS — BP 95/62 | HR 102 | Temp 97.4°F | Resp 18 | Ht 67.5 in | Wt 203.0 lb

## 2016-06-12 DIAGNOSIS — I83891 Varicose veins of right lower extremities with other complications: Secondary | ICD-10-CM | POA: Diagnosis not present

## 2016-06-12 NOTE — Progress Notes (Signed)
Subjective:     Patient ID: Lindsey Potter, female   DOB: 03/31/1951, 66 y.o.   MRN: [id number]  HPI This 66 year old female had multiple stab phlebectomy of painful varicosities in the right lower extremity. Greater than 20 phlebectomy's were performed under local tumescent anesthesia. She tolerated the procedure well.  Review of Systems     Objective:   Physical Exam BP 95/62   Pulse (!) 102   Temp 97.4 F (36.3 C)   Resp 18   Ht 5' 7.5" (1.715 m)   Wt 203 lb (92.1 kg)   SpO2 98%   BMI 31.33 kg/m        Assessment:     Well-tolerated multiple stab phlebectomy painful varicosities right leg performed under local tumescent anesthesia    Plan:     Return in 3 months for further evaluation for possible stab phlebectomy of varicosities left leg

## 2016-06-12 NOTE — Progress Notes (Signed)
    Stab Phlebectomy Procedure  Lindsey HawthorneSusan Potter DOB:05-08-1951  06/12/2016  Consent signed: Yes  Surgeon:J.D. Hart RochesterLawson  Procedure: stab phlebectomy: right leg  BP 95/62   Pulse (!) 102   Temp 97.4 F (36.3 C)   Resp 18   Ht 5' 7.5" (1.715 m)   Wt 203 lb (92.1 kg)   SpO2 98%   BMI 31.33 kg/m   Start time: 9:30am   End time: 10:10am   Tumescent Anesthesia: 210 cc 0.9% NaCl with 50 cc Lidocaine HCL with 1% Epi and 15 cc 8.4% NaHCO3  Local Anesthesia: 5 cc Lidocaine HCL and NaHCO3 (ratio 2:1)    Stab Phlebectomy: >20 Sites: Calf and Ankle  Patient tolerated procedure well: Yes  Notes:   Description of Procedure:  After marking the course of the secondary varicosities, the patient was placed on the operating table in the supine position, and the right leg was prepped and draped in sterile fashion.    The patient was then put into Trendelenburg position.  Local anesthetic was administered at the previously marked varicosities, and tumescent anesthesia was administered around the vessels.  Greater than 20 stab wounds were made using the tip of an 11 blade. And using the vein hook, the phlebectomies were performed using a hemostat to avulse the varicosities.  Adequate hemostasis was achieved, and steri strips were applied to the stab wound.      ABD pads and thigh high compression stockings were applied as well ace wraps where needed. Blood loss was less than 15 cc.  The patient ambulated out of the operating room having tolerated the procedure well.

## 2016-08-30 ENCOUNTER — Encounter: Payer: Self-pay | Admitting: Vascular Surgery

## 2016-09-10 ENCOUNTER — Encounter: Payer: Self-pay | Admitting: Vascular Surgery

## 2016-09-10 ENCOUNTER — Ambulatory Visit (INDEPENDENT_AMBULATORY_CARE_PROVIDER_SITE_OTHER): Payer: Self-pay | Admitting: Vascular Surgery

## 2016-09-10 VITALS — BP 100/58 | HR 95 | Temp 97.0°F | Resp 16 | Ht 67.5 in | Wt 197.0 lb

## 2016-09-10 DIAGNOSIS — I83893 Varicose veins of bilateral lower extremities with other complications: Secondary | ICD-10-CM

## 2016-09-10 NOTE — Progress Notes (Signed)
Subjective:     Patient ID: Lindsey Potter, female   DOB: 10/05/1950, 66 y.o.   MRN: [id number]  HPI This 66 year old female returns 3 months post-laser ablation left great saphenous vein performed for gross reflux with painful varicosities. She has previously undergone vein stripping in the contralateral right leg many years ago and multiple stab phlebectomy of painful varicosities in the right leg by me a few months ago. She continues to have aching and throbbing discomfort in the varicosities in the left leg which are primarily below the knee. She has no history of DVT thrombophlebitis stasis ulcers or bleeding. She is tried Raper-leg elastic compression stockings 20-30 millimeter gradient without success.  Past Medical History:  Diagnosis Date  . Anxiety   . Depression   . Hyperlipidemia   . Hypertension   . Hypothyroidism     Social History  Substance Use Topics  . Smoking status: Never Smoker  . Smokeless tobacco: Never Used  . Alcohol use No    No family history on file.  Allergies  Allergen Reactions  . Demerol [Meperidine] Swelling  . Penicillins     REACTION: rash  . Prilosec Otc [Omeprazole Magnesium] Other (See Comments)    Stomach pain.  Generic form of the pill works better.      Current Outpatient Prescriptions:  .  atorvastatin (LIPITOR) 40 MG tablet, Take 20 mg by mouth at bedtime. , Disp: , Rfl:  .  Cyanocobalamin (VITAMIN B 12 PO), Take 1,000 mcg by mouth., Disp: , Rfl:  .  diphenhydrAMINE (BENADRYL) 25 mg capsule, Take 1 to 2 tablets by mouth at bedtime for sleep and itching , Disp: , Rfl:  .  gabapentin (NEURONTIN) 100 MG capsule, , Disp: , Rfl:  .  INGREZZA 80 MG CAPS, , Disp: , Rfl:  .  levothyroxine (SYNTHROID, LEVOTHROID) 50 MCG tablet, Take 50 mcg by mouth daily before breakfast., Disp: , Rfl:  .  linaclotide (LINZESS) 72 MCG capsule, Take 72 mcg by mouth at bedtime., Disp: , Rfl:  .  omeprazole (PRILOSEC) 20 MG capsule, Take 20 mg by mouth daily., Disp:  , Rfl:  .  QUEtiapine (SEROQUEL XR) 400 MG 24 hr tablet, , Disp: , Rfl:  .  QUEtiapine (SEROQUEL) 200 MG tablet, Take 400 mg by mouth at bedtime. , Disp: , Rfl:  .  rosuvastatin (CRESTOR) 20 MG tablet, Take 20 mg by mouth daily., Disp: , Rfl:  .  sertraline (ZOLOFT) 100 MG tablet, Take 100 mg by mouth daily., Disp: , Rfl:  .  Vitamin D, Ergocalciferol, (DRISDOL) 50000 units CAPS capsule, Take 50,000 Units by mouth 2 (two) times a week., Disp: , Rfl:  .  benztropine (COGENTIN) 2 MG tablet, Take 2 mg by mouth 2 (two) times daily., Disp: , Rfl:  .  divalproex (DEPAKOTE ER) 250 MG 24 hr tablet, , Disp: , Rfl:  .  gabapentin (NEURONTIN) 300 MG capsule, Take 100 mg by mouth 2 (two) times daily. , Disp: , Rfl:  .  INGREZZA 40 MG CAPS, , Disp: , Rfl:  .  LORazepam (ATIVAN) 0.5 MG tablet, Take 0.5 mg by mouth at bedtime.  , Disp: , Rfl:   Vitals:   09/10/16 1030  BP: (!) 100/58  Pulse: 95  Resp: 16  Temp: 97 F (36.1 C)  SpO2: 93%  Weight: 197 lb (89.4 kg)  Height: 5' 7.5" (1.715 m)    Body mass index is 30.4 kg/m.  Review of Systems Denies chest pain, dyspnea on exertion, PND, orthopnea, claudication    Objective:   Physical Exam BP (!) 100/58 (BP Location: Left Arm, Patient Position: Sitting, Cuff Size: Large)   Pulse 95   Temp 97 F (36.1 C)   Resp 16   Ht 5' 7.5" (1.715 m)   Wt 197 lb (89.4 kg)   SpO2 93%   BMI 30.40 kg/m   Tunneled well-developed well-nourished female no apparent distress alert and oriented 3 Lungs no rhonchi or wheezing Left leg with bulging varicosities in the lateral calf and posterior calf and distal posterior thigh. No distal edema noted. 3+ dorsalis pedis pulse palpable.     Assessment:     Residual painful varicosities left leg status post left great saphenous vein laser ablation 3 months ago causing residual symptoms effecting patient's daily living    Plan:     Patient needs multiple stab phlebectomy of residual painful  varicosities left leg-greater than 20. We will proceed with precertification to perform this in the near future and this will complete patient's treatment regimen

## 2016-09-11 ENCOUNTER — Telehealth: Payer: Self-pay | Admitting: *Deleted

## 2016-09-11 NOTE — Telephone Encounter (Signed)
Returning Lindsey Potter's earlier message regarding her office visit yesterday with Dr. Hart Rochester.  [name] has questions about activities of daily living and Dr. Candie Chroman recommendations about office surgery.  Explained to [name] that Dr. Hart Rochester recommended stab phlebectomy procedure on her left leg to remove large ropey varicosities.  [name] is s/p endovenous laser ablation of left greater saphenous vein by Dr. Hart Rochester on 05-29-2016.  Advised [name] (per Dr. Candie Chroman instructions yesterday) that currently it is was OK for her to swim and use hot tub.  Reinforced that for 2 weeks after the stab phlebectomy procedure she would need to avoid/not swim or use hot tub.

## 2016-10-17 ENCOUNTER — Encounter: Payer: Self-pay | Admitting: Family

## 2016-10-30 ENCOUNTER — Encounter: Payer: Self-pay | Admitting: Vascular Surgery

## 2016-10-30 ENCOUNTER — Ambulatory Visit (INDEPENDENT_AMBULATORY_CARE_PROVIDER_SITE_OTHER): Payer: Medicare HMO | Admitting: Vascular Surgery

## 2016-10-30 VITALS — BP 106/77 | HR 119 | Temp 97.3°F | Resp 16 | Ht 67.5 in | Wt 197.0 lb

## 2016-10-30 DIAGNOSIS — I83892 Varicose veins of left lower extremities with other complications: Secondary | ICD-10-CM

## 2016-10-30 HISTORY — PX: OTHER SURGICAL HISTORY: SHX169

## 2016-10-30 NOTE — Progress Notes (Signed)
    Stab Phlebectomy Procedure  Lindsey HawthorneSusan Rabanal DOB:[birth year]  10/30/2016  Consent signed: Yes  Surgeon:J.D. Hart RochesterLawson, MD  Procedure: stab phlebectomy: left leg  BP 106/77 (BP Location: Left Arm, Patient Position: Sitting, Cuff Size: Normal)   Pulse (!) 119   Temp 97.3 F (36.3 C) (Oral)   Resp 16   Ht 5' 7.5" (1.715 m)   Wt 197 lb (89.4 kg)   SpO2 97%   BMI 30.40 kg/m   Start time: 1:00pm   End time: 1:45pm   Tumescent Anesthesia: 350 cc 0.9% NaCl with 50 cc Bupivacaine HCL 0.5% with and 15 cc 8.4% NaHCO3  Local Anesthesia: 20 cc Bupivacaine  HCL 0.5%    Stab Phlebectomy: >20 Sites: Calf and Ankle  Patient tolerated procedure well: Yes    Description of Procedure:  After marking the course of the secondary varicosities, the patient was placed on the operating table in the supine position, and the left leg was prepped and draped in sterile fashion.    The patient was then put into Trendelenburg position.  Local anesthetic was administered at the previously marked varicosities, and tumescent anesthesia was administered around the vessels.  Greater than 20 stab wounds were made using the tip of an 11 blade. And using the vein hook, the phlebectomies were performed using a hemostat to avulse the varicosities.  Adequate hemostasis was achieved, and steri strips were applied to the stab wound.      ABD pads and thigh high compression stockings were applied as well ace wraps where needed. Blood loss was less than 15 cc.  The patient ambulated out of the operating room having tolerated the procedure well.

## 2016-10-30 NOTE — Progress Notes (Signed)
Subjective:     Patient ID: Lindsey HawthorneSusan Schoen, female   DOB: 1951/03/28, 66 y.o.   MRN: [id number]  HPI This 66 year old female had multiple stab phlebectomy-approximately 30-of painful varicosities left leg performed under local tumescent anesthesia. She tolerated procedure well.  Review of Systems     Objective:   Physical Exam BP 106/77 (BP Location: Left Arm, Patient Position: Sitting, Cuff Size: Normal)   Pulse (!) 119   Temp 97.3 F (36.3 C) (Oral)   Resp 16   Ht 5' 7.5" (1.715 m)   Wt 197 lb (89.4 kg)   SpO2 97%   BMI 30.40 kg/m        Assessment:     Well-tolerated multiple stab phlebectomy painful varicosities performed under local tumescent anesthesia-greater than 20    Plan:     Return to see me on a when necessary basis

## 2016-11-01 ENCOUNTER — Encounter: Payer: Self-pay | Admitting: Vascular Surgery

## 2017-02-18 DIAGNOSIS — M1711 Unilateral primary osteoarthritis, right knee: Secondary | ICD-10-CM | POA: Insufficient documentation

## 2021-01-26 ENCOUNTER — Ambulatory Visit: Payer: Medicare Other | Admitting: Podiatry

## 2021-02-13 ENCOUNTER — Other Ambulatory Visit: Payer: Self-pay

## 2021-02-13 ENCOUNTER — Ambulatory Visit (INDEPENDENT_AMBULATORY_CARE_PROVIDER_SITE_OTHER): Payer: Medicare Other | Admitting: Podiatry

## 2021-02-13 DIAGNOSIS — M2042 Other hammer toe(s) (acquired), left foot: Secondary | ICD-10-CM | POA: Diagnosis not present

## 2021-02-13 DIAGNOSIS — B351 Tinea unguium: Secondary | ICD-10-CM | POA: Diagnosis not present

## 2021-02-13 NOTE — Progress Notes (Signed)
  Subjective:  Patient ID: Lindsey Potter, female    DOB: [birth year],  MRN: [id number]  Chief Complaint  Patient presents with   debride    BL nails care -pt can't trimm nails herself -pt need foot checked   70 y.o. female presents with the above complaint. History confirmed with patient. Denies pain in the toenails. States she cannot bend down to cut them herself. Would like to discuss the 2nd toe on the left side. States it does not hurt but limits the shoes she would like to wear. Objective:  Physical Exam: warm, good capillary refill, no trophic changes or ulcerative lesions, normal DP and PT pulses, and normal sensory exam. Hallux extensus deformity bilat with dystrophic hallux nail. Other nails thin. No POP about the toenails. Rigid 2nd hammertoe left foot. Assessment:   1. Hammer toe of left foot   2. Onychomycosis    Plan:  Patient was evaluated and treated and all questions answered.  Onychomycosis -Nails debrided per patient request  Procedure: Nail Debridement Type of Debridement: manual, sharp debridement. Instrumentation: Nail nipper, rotary burr. Number of Nails: 10  Hammertoe left 2nd toe -Will discuss further next visit. Will obtain Xrs  Return in about 4 weeks (around 03/13/2021) for Hammertoe eval , with XRs.

## 2021-03-13 ENCOUNTER — Ambulatory Visit (INDEPENDENT_AMBULATORY_CARE_PROVIDER_SITE_OTHER): Payer: Medicare Other | Admitting: Podiatry

## 2021-03-13 ENCOUNTER — Other Ambulatory Visit: Payer: Self-pay

## 2021-03-13 ENCOUNTER — Ambulatory Visit (INDEPENDENT_AMBULATORY_CARE_PROVIDER_SITE_OTHER): Payer: Medicare Other

## 2021-03-13 DIAGNOSIS — M2042 Other hammer toe(s) (acquired), left foot: Secondary | ICD-10-CM | POA: Diagnosis not present

## 2021-03-13 DIAGNOSIS — B351 Tinea unguium: Secondary | ICD-10-CM

## 2021-03-13 NOTE — Progress Notes (Signed)
  Subjective:  Patient ID: Lindsey Potter, female    DOB: [birth year],  MRN: [id number]  No chief complaint on file.  70 y.o. female presents with the above complaint. 2nd toe on the left is still causing pain. Denies prior treatment. Objective:  Physical Exam: warm, good capillary refill, no trophic changes or ulcerative lesions, normal DP and PT pulses, and normal sensory exam. Hallux extensus deformity bilat with dystrophic hallux nail. Other nails thin. No POP about the toenails. Rigid 2nd hammertoe left foot.  Radiographs: taken and reviewed. 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 Assessment:   1. Hammer toe of left foot   2. Onychomycosis    Plan:  Patient was evaluated and treated and all questions answered.  Hammertoe left 2nd toe -XR reviewed with patient. -We discussed treatment options. We discussed proceeding with padding - dispensed corn cushion and silicone toe cap to help alleviate pressure. She may benefit from surgical correction. Will discuss more. I am concerned with her resting tremor the increased risk of complications of pin fixation - could consider screw fixation.  Return in about 6 weeks (around 04/24/2021).

## 2021-04-24 ENCOUNTER — Other Ambulatory Visit: Payer: Self-pay

## 2021-04-24 ENCOUNTER — Ambulatory Visit (INDEPENDENT_AMBULATORY_CARE_PROVIDER_SITE_OTHER): Payer: Medicare Other | Admitting: Podiatry

## 2021-04-24 DIAGNOSIS — K5900 Constipation, unspecified: Secondary | ICD-10-CM | POA: Insufficient documentation

## 2021-04-24 DIAGNOSIS — M2042 Other hammer toe(s) (acquired), left foot: Secondary | ICD-10-CM

## 2021-04-24 DIAGNOSIS — R011 Cardiac murmur, unspecified: Secondary | ICD-10-CM | POA: Insufficient documentation

## 2021-04-24 DIAGNOSIS — F411 Generalized anxiety disorder: Secondary | ICD-10-CM | POA: Insufficient documentation

## 2021-04-24 DIAGNOSIS — R269 Unspecified abnormalities of gait and mobility: Secondary | ICD-10-CM | POA: Insufficient documentation

## 2021-04-24 DIAGNOSIS — I5023 Acute on chronic systolic (congestive) heart failure: Secondary | ICD-10-CM | POA: Insufficient documentation

## 2021-04-24 DIAGNOSIS — D518 Other vitamin B12 deficiency anemias: Secondary | ICD-10-CM | POA: Insufficient documentation

## 2021-04-24 DIAGNOSIS — E042 Nontoxic multinodular goiter: Secondary | ICD-10-CM | POA: Insufficient documentation

## 2021-04-24 DIAGNOSIS — I700 Atherosclerosis of aorta: Secondary | ICD-10-CM | POA: Insufficient documentation

## 2021-04-24 DIAGNOSIS — R0989 Other specified symptoms and signs involving the circulatory and respiratory systems: Secondary | ICD-10-CM | POA: Insufficient documentation

## 2021-04-24 DIAGNOSIS — Z0000 Encounter for general adult medical examination without abnormal findings: Secondary | ICD-10-CM | POA: Insufficient documentation

## 2021-04-24 DIAGNOSIS — M21962 Unspecified acquired deformity of left lower leg: Secondary | ICD-10-CM

## 2021-04-24 DIAGNOSIS — I10 Essential (primary) hypertension: Secondary | ICD-10-CM | POA: Insufficient documentation

## 2021-04-24 DIAGNOSIS — I479 Paroxysmal tachycardia, unspecified: Secondary | ICD-10-CM | POA: Insufficient documentation

## 2021-04-24 DIAGNOSIS — I6523 Occlusion and stenosis of bilateral carotid arteries: Secondary | ICD-10-CM | POA: Insufficient documentation

## 2021-04-24 DIAGNOSIS — I714 Abdominal aortic aneurysm, without rupture, unspecified: Secondary | ICD-10-CM | POA: Insufficient documentation

## 2021-04-24 DIAGNOSIS — E559 Vitamin D deficiency, unspecified: Secondary | ICD-10-CM | POA: Insufficient documentation

## 2021-04-24 DIAGNOSIS — K219 Gastro-esophageal reflux disease without esophagitis: Secondary | ICD-10-CM | POA: Insufficient documentation

## 2021-04-24 DIAGNOSIS — M810 Age-related osteoporosis without current pathological fracture: Secondary | ICD-10-CM | POA: Insufficient documentation

## 2021-04-24 DIAGNOSIS — E119 Type 2 diabetes mellitus without complications: Secondary | ICD-10-CM | POA: Insufficient documentation

## 2021-04-24 DIAGNOSIS — G9009 Other idiopathic peripheral autonomic neuropathy: Secondary | ICD-10-CM | POA: Insufficient documentation

## 2021-04-24 NOTE — Progress Notes (Signed)
  Subjective:  Patient ID: Lindsey Potter, female    DOB: 06/01/1950,  MRN: [id number]  Chief Complaint  Patient presents with   Hammer Toe     F/U Lt 2nd HT -pt states," bothers sam eas it was." - pt states she is having a hard time keeping cap on tx: none   70 y.o. female presents with the above complaint. 2nd toe pain unchanged and would like to discuss other options. Objective:  Physical Exam: warm, good capillary refill, no trophic changes or ulcerative lesions, normal DP and PT pulses, and normal sensory exam. Hallux extensus deformity bilat with dystrophic hallux nail. Other nails thin. No POP about the toenails. Rigid 2nd hammertoe left foot.  03/14/21 Radiographs: taken and reviewed. 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 Elongated 2nd metatarsal Assessment:   1. Hammer toe of left foot   2. Metatarsal deformity, left    Plan:  Patient was evaluated and treated and all questions answered.  Hammertoe left 2nd toe -Wound benefit from surgical intervention.  -Patient has failed conservative therapy and wishes to proceed with surgical intervention. All risks, benefits, and alternatives discussed with patient. No guarantees given. Consent reviewed and signed by patient. -Planned procedures: left 2nd hammertoe correction, 2nd metatarsal shortening ostoetomy. -Post-op anticoagulation: chemoprophylaxis not indicated   No follow-ups on file.

## 2021-05-11 ENCOUNTER — Telehealth: Payer: Self-pay | Admitting: Podiatry

## 2021-05-11 NOTE — Telephone Encounter (Addendum)
DOS: 06/02/2021  Hammertoe Repair 2nd Left - [id number] Metatarsal Osteotomy 2nd Left - 28308  [facility] Medicare Effective: 05/28/2021  Deductible: $0 Out of Pocket: $8,300 with $0 met. CoInsurance: 20% Copay: $0  Per Cathren Laine C Prior Auth is Required for CPT code [phone] but is NOT required for CPT code 28308.  Call Ref# [id number]  Auth# [id number]

## 2021-05-18 ENCOUNTER — Telehealth: Payer: Self-pay | Admitting: Urology

## 2021-05-18 NOTE — Telephone Encounter (Signed)
DOS - 06/02/21  METATARSAL OSTEOTOMY 2ND LEFT --- 28308 HAMMERTOE REPAIR 2ND LEFT ---- [id number]  [facility] EFFECTIVE DATE - 08/26/20   PER [employer] WEBSITE FOR CPT CODES 17793 AND 90300 HAS BEEN APPROVED, AUTH # [id number], GOOD FROM 06/02/21 - 08/31/21

## 2021-05-26 ENCOUNTER — Encounter (HOSPITAL_BASED_OUTPATIENT_CLINIC_OR_DEPARTMENT_OTHER): Payer: Self-pay | Admitting: Podiatry

## 2021-05-26 ENCOUNTER — Other Ambulatory Visit: Payer: Self-pay

## 2021-05-26 NOTE — Progress Notes (Addendum)
Spoke w/ via phone for pre-op interview--- Lindsey Potter needs dos----     None per anesthesia, MD orders pending.       Lab results------ COVID test -----patient states asymptomatic no test needed Arrive at -------0530 NPO after MN NO Solid Food.  Clear liquids from MN until--- Med rec completed Medications to take morning of surgery ----- Cogentin, Gabapentin, Synthroid, Prilosec and Seroquel. Diabetic medication ----- Patient instructed no nail polish to be worn day of surgery Patient instructed to bring photo id and insurance card day of surgery Patient aware to have Driver (ride ) / caregiver  Cedric Fishman (Friend)  for 24 hours after surgery  Patient Special Instructions ----- Pre-Op special Istructions ----- Patient verbalized understanding of instructions that were given at this phone interview. Patient denies shortness of breath, chest pain, fever, cough at this phone interview.   H&P on chart

## 2021-06-01 ENCOUNTER — Telehealth: Payer: Self-pay | Admitting: *Deleted

## 2021-06-01 NOTE — Anesthesia Preprocedure Evaluation (Addendum)
Anesthesia Evaluation  Patient identified by MRN, date of birth, ID band Patient awake    Reviewed: Allergy & Precautions, NPO status , Patient's Chart, lab work & pertinent test results  History of Anesthesia Complications Negative for: history of anesthetic complications  Airway Mallampati: II  TM Distance: >3 FB Neck ROM: Full    Dental no notable dental hx.    Pulmonary neg pulmonary ROS,    Pulmonary exam normal        Cardiovascular hypertension, Normal cardiovascular exam     Neuro/Psych Anxiety Depression Bipolar Disorder    GI/Hepatic Neg liver ROS, GERD  Medicated and Controlled,  Endo/Other  diabetes, Type 2Hypothyroidism   Renal/GU negative Renal ROS  negative genitourinary   Musculoskeletal  (+) Arthritis ,   Abdominal   Peds  Hematology negative hematology ROS (+)   Anesthesia Other Findings Day of surgery medications reviewed with patient.  Reproductive/Obstetrics negative OB ROS                            Anesthesia Physical Anesthesia Plan  ASA: 2  Anesthesia Plan: MAC   Post-op Pain Management: Tylenol PO (pre-op)   Induction: Intravenous  PONV Risk Score and Plan: 2 and Ondansetron, Treatment may vary due to age or medical condition, Propofol infusion and Midazolam  Airway Management Planned: Natural Airway and Simple Face Mask  Additional Equipment: None  Intra-op Plan:   Post-operative Plan:   Informed Consent: I have reviewed the patients History and Physical, chart, labs and discussed the procedure including the risks, benefits and alternatives for the proposed anesthesia with the patient or authorized representative who has indicated his/her understanding and acceptance.       Plan Discussed with: CRNA  Anesthesia Plan Comments:       Anesthesia Quick Evaluation

## 2021-06-01 NOTE — Telephone Encounter (Addendum)
Lindsey Potter w/ Lindsey Potter is calling for orders to be put in for patient so that they may fill out consent for tomorrow's surgery.Please advise. Returned the call to surgery center that the orders have been put in.Verbalized understanding

## 2021-06-01 NOTE — Telephone Encounter (Signed)
Can you call them back and let them know they are in?

## 2021-06-01 NOTE — Progress Notes (Signed)
Left message for scheduler to have Dr. Samuella Cota to put orders in

## 2021-06-01 NOTE — Progress Notes (Signed)
Pt was aware of time change to 0730

## 2021-06-01 NOTE — Telephone Encounter (Signed)
notified

## 2021-06-02 ENCOUNTER — Ambulatory Visit (HOSPITAL_BASED_OUTPATIENT_CLINIC_OR_DEPARTMENT_OTHER)
Admission: RE | Admit: 2021-06-02 | Discharge: 2021-06-02 | Disposition: A | Discharge status: Home / Self Care | Payer: Medicare Other | Attending: Podiatry | Admitting: Podiatry

## 2021-06-02 ENCOUNTER — Encounter (HOSPITAL_BASED_OUTPATIENT_CLINIC_OR_DEPARTMENT_OTHER)
Admission: RE | Disposition: A | Discharge status: Home / Self Care | Payer: Self-pay | Source: Home / Self Care | Attending: Podiatry

## 2021-06-02 ENCOUNTER — Ambulatory Visit (HOSPITAL_BASED_OUTPATIENT_CLINIC_OR_DEPARTMENT_OTHER): Payer: Medicare Other | Admitting: Anesthesiology

## 2021-06-02 ENCOUNTER — Ambulatory Visit (HOSPITAL_BASED_OUTPATIENT_CLINIC_OR_DEPARTMENT_OTHER): Payer: Medicare Other

## 2021-06-02 ENCOUNTER — Encounter: Payer: Self-pay | Admitting: Podiatry

## 2021-06-02 ENCOUNTER — Encounter (HOSPITAL_BASED_OUTPATIENT_CLINIC_OR_DEPARTMENT_OTHER): Payer: Self-pay | Admitting: Podiatry

## 2021-06-02 DIAGNOSIS — K219 Gastro-esophageal reflux disease without esophagitis: Secondary | ICD-10-CM | POA: Insufficient documentation

## 2021-06-02 DIAGNOSIS — M2042 Other hammer toe(s) (acquired), left foot: Secondary | ICD-10-CM | POA: Diagnosis present

## 2021-06-02 DIAGNOSIS — M1990 Unspecified osteoarthritis, unspecified site: Secondary | ICD-10-CM | POA: Diagnosis not present

## 2021-06-02 DIAGNOSIS — M2041 Other hammer toe(s) (acquired), right foot: Secondary | ICD-10-CM

## 2021-06-02 DIAGNOSIS — S93332A Other subluxation of left foot, initial encounter: Secondary | ICD-10-CM | POA: Diagnosis not present

## 2021-06-02 DIAGNOSIS — I10 Essential (primary) hypertension: Secondary | ICD-10-CM | POA: Insufficient documentation

## 2021-06-02 DIAGNOSIS — M216X2 Other acquired deformities of left foot: Secondary | ICD-10-CM | POA: Diagnosis not present

## 2021-06-02 DIAGNOSIS — E119 Type 2 diabetes mellitus without complications: Secondary | ICD-10-CM | POA: Insufficient documentation

## 2021-06-02 DIAGNOSIS — M7741 Metatarsalgia, right foot: Secondary | ICD-10-CM | POA: Diagnosis not present

## 2021-06-02 DIAGNOSIS — F319 Bipolar disorder, unspecified: Secondary | ICD-10-CM | POA: Diagnosis not present

## 2021-06-02 DIAGNOSIS — F419 Anxiety disorder, unspecified: Secondary | ICD-10-CM | POA: Insufficient documentation

## 2021-06-02 DIAGNOSIS — E039 Hypothyroidism, unspecified: Secondary | ICD-10-CM | POA: Insufficient documentation

## 2021-06-02 DIAGNOSIS — X58XXXA Exposure to other specified factors, initial encounter: Secondary | ICD-10-CM | POA: Insufficient documentation

## 2021-06-02 HISTORY — PX: METATARSAL OSTEOTOMY: SHX1641

## 2021-06-02 HISTORY — DX: Other hammer toe(s) (acquired), unspecified foot: M20.40

## 2021-06-02 HISTORY — PX: HAMMER TOE SURGERY: SHX385

## 2021-06-02 SURGERY — CORRECTION, HAMMER TOE
Anesthesia: Monitor Anesthesia Care | Site: Toe | Laterality: Left

## 2021-06-02 MED ORDER — FENTANYL CITRATE (PF) 100 MCG/2ML IJ SOLN: 25.0000 ug | INTRAMUSCULAR | Status: DC | PRN

## 2021-06-02 MED ORDER — FENTANYL CITRATE (PF) 100 MCG/2ML IJ SOLN
INTRAMUSCULAR | Status: DC | PRN
  Administered 2021-06-02: 25 ug via INTRAVENOUS

## 2021-06-02 MED ORDER — MIDAZOLAM HCL 2 MG/2ML IJ SOLN
INTRAMUSCULAR | Status: AC
  Filled 2021-06-02: qty 2

## 2021-06-02 MED ORDER — OXYCODONE-ACETAMINOPHEN 5-325 MG PO TABS: 1.0000 | ORAL_TABLET | ORAL | 0 refills | Status: DC | PRN

## 2021-06-02 MED ORDER — LACTATED RINGERS IV SOLN: INTRAVENOUS | Status: DC

## 2021-06-02 MED ORDER — OXYCODONE HCL 5 MG PO TABS: 5.0000 mg | ORAL_TABLET | Freq: Once | ORAL | Status: DC | PRN

## 2021-06-02 MED ORDER — 0.9 % SODIUM CHLORIDE (POUR BTL) OPTIME
TOPICAL | Status: DC | PRN
  Administered 2021-06-02: 500 mL

## 2021-06-02 MED ORDER — MIDAZOLAM HCL 5 MG/5ML IJ SOLN
INTRAMUSCULAR | Status: DC | PRN
  Administered 2021-06-02: .5 mg via INTRAVENOUS

## 2021-06-02 MED ORDER — PROPOFOL 1000 MG/100ML IV EMUL
INTRAVENOUS | Status: AC
  Filled 2021-06-02: qty 100

## 2021-06-02 MED ORDER — CLINDAMYCIN PHOSPHATE 600 MG/50ML IV SOLN
INTRAVENOUS | Status: DC | PRN
  Administered 2021-06-02: 600 mg via INTRAVENOUS

## 2021-06-02 MED ORDER — PROPOFOL 10 MG/ML IV BOLUS
INTRAVENOUS | Status: DC | PRN
Start: 2021-06-02 — End: 2021-06-02
  Administered 2021-06-02: 20 mg via INTRAVENOUS

## 2021-06-02 MED ORDER — FENTANYL CITRATE (PF) 100 MCG/2ML IJ SOLN
INTRAMUSCULAR | Status: AC
  Filled 2021-06-02: qty 2

## 2021-06-02 MED ORDER — PHENYLEPHRINE 40 MCG/ML (10ML) SYRINGE FOR IV PUSH (FOR BLOOD PRESSURE SUPPORT)
PREFILLED_SYRINGE | INTRAVENOUS | Status: AC
  Filled 2021-06-02: qty 10

## 2021-06-02 MED ORDER — EPHEDRINE SULFATE 50 MG/ML IJ SOLN
INTRAMUSCULAR | Status: DC | PRN
  Administered 2021-06-02: 15 mg via INTRAVENOUS
  Administered 2021-06-02: 10 mg via INTRAVENOUS

## 2021-06-02 MED ORDER — BUPIVACAINE HCL 0.5 % IJ SOLN
INTRAMUSCULAR | Status: DC | PRN
Start: 2021-06-02 — End: 2021-06-02
  Administered 2021-06-02: 10 mL

## 2021-06-02 MED ORDER — PHENYLEPHRINE HCL (PRESSORS) 10 MG/ML IV SOLN
INTRAVENOUS | Status: DC | PRN
  Administered 2021-06-02: 120 ug via INTRAVENOUS
  Administered 2021-06-02 (×5): 80 ug via INTRAVENOUS

## 2021-06-02 MED ORDER — OXYCODONE HCL 5 MG/5ML PO SOLN: 5.0000 mg | Freq: Once | ORAL | Status: DC | PRN

## 2021-06-02 MED ORDER — PROPOFOL 500 MG/50ML IV EMUL
INTRAVENOUS | Status: DC | PRN
  Administered 2021-06-02: 150 ug/kg/min via INTRAVENOUS

## 2021-06-02 MED ORDER — ACETAMINOPHEN 500 MG PO TABS
1000.0000 mg | ORAL_TABLET | Freq: Once | ORAL | Status: AC
  Administered 2021-06-02: 1000 mg via ORAL

## 2021-06-02 MED ORDER — ONDANSETRON HCL 4 MG/2ML IJ SOLN
INTRAMUSCULAR | Status: DC | PRN
  Administered 2021-06-02: 4 mg via INTRAVENOUS

## 2021-06-02 MED ORDER — LIDOCAINE HCL (CARDIAC) PF 100 MG/5ML IV SOSY
PREFILLED_SYRINGE | INTRAVENOUS | Status: DC | PRN
Start: 2021-06-02 — End: 2021-06-02
  Administered 2021-06-02: 40 mg via INTRAVENOUS

## 2021-06-02 MED ORDER — CLINDAMYCIN PHOSPHATE 600 MG/50ML IV SOLN
INTRAVENOUS | Status: AC
  Filled 2021-06-02: qty 50

## 2021-06-02 MED ORDER — ACETAMINOPHEN 500 MG PO TABS
ORAL_TABLET | ORAL | Status: AC
  Filled 2021-06-02: qty 2

## 2021-06-02 SURGICAL SUPPLY — 80 items
APL PRP STRL LF DISP 70% ISPRP (MISCELLANEOUS) ×1
BANDAGE ESMARK 6X9 LF (GAUZE/BANDAGES/DRESSINGS) ×2 IMPLANT
BLADE AVERAGE 25X9 (BLADE) ×1 IMPLANT
BLADE MINI RND TIP GREEN BEAV (BLADE) ×1 IMPLANT
BLADE OSC/SAG .038X5.5 CUT EDG (BLADE) ×1 IMPLANT
BLADE OSCILLATING/SAGITTAL (BLADE) ×2
BLADE SURG 15 STRL LF DISP TIS (BLADE) ×2 IMPLANT
BLADE SURG 15 STRL SS (BLADE) ×2
BLADE SW THK.38XMED LNG THN (BLADE) ×2 IMPLANT
BNDG CMPR 9X4 STRL LF SNTH (GAUZE/BANDAGES/DRESSINGS) ×1
BNDG CMPR 9X6 STRL LF SNTH (GAUZE/BANDAGES/DRESSINGS) ×1
BNDG CONFORM 2 STRL LF (GAUZE/BANDAGES/DRESSINGS) IMPLANT
BNDG ELASTIC 3X5.8 VLCR STR LF (GAUZE/BANDAGES/DRESSINGS) ×3 IMPLANT
BNDG ELASTIC 4X5.8 VLCR STR LF (GAUZE/BANDAGES/DRESSINGS) ×3 IMPLANT
BNDG ESMARK 4X9 LF (GAUZE/BANDAGES/DRESSINGS) ×3 IMPLANT
BNDG ESMARK 6X9 LF (GAUZE/BANDAGES/DRESSINGS) ×2
BNDG GAUZE ELAST 4 BULKY (GAUZE/BANDAGES/DRESSINGS) ×3 IMPLANT
CHLORAPREP W/TINT 26 (MISCELLANEOUS) ×3 IMPLANT
COVER BACK TABLE 60X90IN (DRAPES) ×3 IMPLANT
CUFF TOURN SGL QUICK 18 (TOURNIQUET CUFF) ×1 IMPLANT
CUFF TOURN SGL QUICK 18X4 (TOURNIQUET CUFF) ×1 IMPLANT
CUFF TOURN SGL QUICK 24 (TOURNIQUET CUFF)
CUFF TOURN SGL QUICK 34 (TOURNIQUET CUFF)
CUFF TRNQT CYL 24X4X16.5-23 (TOURNIQUET CUFF) IMPLANT
CUFF TRNQT CYL 34X4.125X (TOURNIQUET CUFF) IMPLANT
DECANTER SPIKE VIAL GLASS SM (MISCELLANEOUS) IMPLANT
DRAPE 3/4 80X56 (DRAPES) ×3 IMPLANT
DRAPE C-ARM 35X43 STRL (DRAPES) ×3 IMPLANT
DRAPE EXTREMITY T 121X128X90 (DISPOSABLE) ×3 IMPLANT
DRAPE OEC MINIVIEW 54X84 (DRAPES) ×3 IMPLANT
DRAPE U-SHAPE 47X51 STRL (DRAPES) ×2 IMPLANT
ELECT REM PT RETURN 9FT ADLT (ELECTROSURGICAL) ×2
ELECTRODE REM PT RTRN 9FT ADLT (ELECTROSURGICAL) ×2 IMPLANT
GAUZE 4X4 16PLY ~~LOC~~+RFID DBL (SPONGE) ×3 IMPLANT
GAUZE SPONGE 4X4 12PLY STRL (GAUZE/BANDAGES/DRESSINGS) ×3 IMPLANT
GAUZE SPONGE 4X4 12PLY STRL LF (GAUZE/BANDAGES/DRESSINGS) ×1 IMPLANT
GAUZE XEROFORM 1X8 LF (GAUZE/BANDAGES/DRESSINGS) ×3 IMPLANT
GLOVE SRG 8 PF TXTR STRL LF DI (GLOVE) ×2 IMPLANT
GLOVE SURG ENC MOIS LTX SZ8 (GLOVE) ×3 IMPLANT
GLOVE SURG UNDER POLY LF SZ8 (GLOVE) ×2
GOWN SPEC L3 XXLG W/TWL (GOWN DISPOSABLE) ×3 IMPLANT
GOWN STRL REUS W/TWL 2XL LVL3 (GOWN DISPOSABLE) ×3 IMPLANT
K-WIRE SURGICAL 1.6X102 (WIRE) IMPLANT
KIT TURNOVER CYSTO (KITS) ×3 IMPLANT
NDL HYPO 25X1 1.5 SAFETY (NEEDLE) ×2 IMPLANT
NEEDLE HYPO 25X1 1.5 SAFETY (NEEDLE) ×2 IMPLANT
NS IRRIG 1000ML POUR BTL (IV SOLUTION) ×2 IMPLANT
NS IRRIG 500ML POUR BTL (IV SOLUTION) ×4 IMPLANT
PACK BASIN DAY SURGERY FS (CUSTOM PROCEDURE TRAY) ×3 IMPLANT
PAD CAST 4YDX4 CTTN HI CHSV (CAST SUPPLIES) IMPLANT
PADDING CAST COTTON 4X4 STRL (CAST SUPPLIES) ×2
PENCIL SMOKE EVACUATOR (MISCELLANEOUS) ×3 IMPLANT
PIN CAPS ORTHO GREEN .062 (PIN) IMPLANT
RASP BONE SMALL TEAR RASP (RASP) ×1 IMPLANT
RASP SM TEAR CROSS CUT (RASP) ×1 IMPLANT
SCREW HEADLESS 2.5X12MM (Screw) ×1 IMPLANT
SCREW HEADLESS 40X2.5 (Screw) ×1 IMPLANT
STAPLER VISISTAT 35W (STAPLE) IMPLANT
STOCKINETTE 6  STRL (DRAPES) ×2
STOCKINETTE 6 STRL (DRAPES) ×2 IMPLANT
SUCTION FRAZIER HANDLE 10FR (MISCELLANEOUS) ×2
SUCTION TUBE FRAZIER 10FR DISP (MISCELLANEOUS) ×2 IMPLANT
SUT ETHILON 4 0 PS 2 18 (SUTURE) ×4 IMPLANT
SUT MNCRL AB 3-0 PS2 18 (SUTURE) ×4 IMPLANT
SUT MNCRL AB 3-0 PS2 27 (SUTURE) IMPLANT
SUT MNCRL AB 4-0 PS2 18 (SUTURE) ×3 IMPLANT
SUT MON AB 5-0 PS2 18 (SUTURE) IMPLANT
SUT VIC AB 2-0 PS2 27 (SUTURE) IMPLANT
SUT VIC AB 2-0 SH 27 (SUTURE) ×2
SUT VIC AB 2-0 SH 27XBRD (SUTURE) ×2 IMPLANT
SUT VIC AB 3-0 FS2 27 (SUTURE) IMPLANT
SUT VICRYL 4-0 PS2 18IN ABS (SUTURE) IMPLANT
SYR BULB EAR ULCER 3OZ GRN STR (SYRINGE) ×3 IMPLANT
SYR CONTROL 10ML LL (SYRINGE) ×3 IMPLANT
TOWEL OR 17X26 10 PK STRL BLUE (TOWEL DISPOSABLE) ×3 IMPLANT
TRAY DSU PREP LF (CUSTOM PROCEDURE TRAY) IMPLANT
TUBE CONNECTING 12X1/4 (SUCTIONS) ×3 IMPLANT
UNDERPAD 30X36 HEAVY ABSORB (UNDERPADS AND DIAPERS) ×3 IMPLANT
WIRE GUIDE 4INX.035 (WIRE) ×1 IMPLANT
YANKAUER SUCT BULB TIP NO VENT (SUCTIONS) IMPLANT

## 2021-06-02 NOTE — H&P (Signed)
°  Subjective:  Patient ID: Lindsey Potter, female    DOB: 10-Jan-1951,  MRN: [id number]  No chief complaint on file.  71 y.o. female presents for elective surgery. All questions answered. Denies interval changes since last visit.  Objective:  Physical Exam: warm, good capillary refill, no trophic changes or ulcerative lesions, normal DP and PT pulses, and normal sensory exam. Hallux extensus deformity bilat with dystrophic hallux nail. Other nails thin. No POP about the toenails. Rigid 2nd hammertoe left foot.  03/14/21 Radiographs: Severe HT deformity left, MPJ joint not congruous. Elongated 2nd metatarsal  Assessment/Plan:  Patient was evaluated and treated and all questions answered.  Hammertoe left 2nd toe -Surgical plan reviewed with patient. -Patient has failed conservative therapy and wishes to proceed with surgical intervention. All risks, benefits, and alternatives discussed with patient. No guarantees given. Consent reviewed and signed by patient. -Planned procedures: left 2nd hammertoe correction, 2nd metatarsal shortening ostoetomy. -Post-op anticoagulation: chemoprophylaxis not indicated   No follow-ups on file.

## 2021-06-02 NOTE — Anesthesia Postprocedure Evaluation (Signed)
Anesthesia Post Note  Patient: Lindsey Potter  Procedure(s) Performed: HAMMER TOE CORRECTION 2ND TOE LEFT FOOT (Left: Toe) METATARSAL OSTEOTOMY (Left: Toe)     Patient location during evaluation: PACU Anesthesia Type: MAC Level of consciousness: awake and alert and oriented Pain management: pain level controlled Vital Signs Assessment: post-procedure vital signs reviewed and stable Respiratory status: spontaneous breathing, nonlabored ventilation and respiratory function stable Cardiovascular status: blood pressure returned to baseline Postop Assessment: no apparent nausea or vomiting Anesthetic complications: no   No notable events documented.  Last Vitals:  Vitals:   06/02/21 1045 06/02/21 1100  BP: 110/75 115/70  Pulse: 77 97  Resp: 12 (!) 24  Temp:    SpO2: 96% 97%    Last Pain:  Vitals:   06/02/21 1100  TempSrc:   PainSc: 0-No pain                 Marthenia Rolling

## 2021-06-02 NOTE — Op Note (Signed)
Patient Name: Lindsey Potter DOB: Jun 24, 1950  MRN: [id number]   Date of Surgery: 06/02/2021  Surgeon: Dr. Hardie Pulley, DPM Assistants: none  Pre-operative Diagnosis:  Hammertoe left 2nd toe, metatarsal deformity 2nd metatarsal with subluxation of joint. Post-operative Diagnosis:  Same Procedures:  1) 2nd metatarsal shortening osteotomy  2) 2nd toe hammertoe correction Pathology/Specimens: * No specimens in log * Anesthesia: MAC/local Hemostasis:  Total Tourniquet Time Documented: Ankle (Left) - 28 minutes Total: Ankle (Left) - 28 minutes  Estimated Blood Loss: 20 mL Materials:  Implant Name Type Inv. Item Serial No. Manufacturer Lot No. LRB No. Used Action  SCREW HEADLESS 2.5X12MM - [id number] Screw SCREW HEADLESS 2.5X12MM  [employer]  Left 1 Implanted  SCREW HEADLESS 40X2.5 - [id number] Screw SCREW HEADLESS 40X2.5  [employer]  Left 1 Implanted   Medications: 10 ml 0.5% marcaine plain Complications: none  Indications for Procedure:  This is a 71 y.o. female with a chronic deformity to the 2nd toe with subluxation of the digit. She presents for elective correction   Procedure in Detail: Patient was identified in pre-operative holding area. Formal consent was signed and the left lower extremity was marked. Patient was brought back to the operating room. Anesthesia was induced. The extremity was prepped and draped in the usual sterile fashion. Timeout was taken to confirm patient name, laterality, and procedure prior to incision.   An incision was then made on the dorsal aspect of the left 2nd metatarsal.  Dissection was carried down through skin and subcutaneous tissue with care to avoid all vital neurovascular structures.  Dissection was carried down to level of the extensor tendon complex.  An incision was made between the extensor tendons and into the metatarsal capsule.  The second metatarsal capsule was then gently freed to allow for exposure of the metatarsal  head.  The collateral ligaments were left intact.  A sagittal saw was used to perform an osteotomy about the metatarsal head parallel to the weightbearing surface.  The capital fragment migrated proximally under tension of the collaterals.  This was pinned in position and the positioning was checked on fluoroscopy.  It was found to be adequate and the pin was countersunk and a Stryker 2.5 mm size #12 screw was used to fixate the osteotomy.  Fluoroscopy was again checked to show adequate position.  The overlying ridge of bone was then removed with a rongeur and the margin was rasped smooth.   Attention was then directed to the 2nd toe.  Incision was made at the dorsal to the digit centered over the interphalangeal joint. Dissection was carried down to the level of the extensor tendon.  The extensor tendon was transected distal to the proximal phalangeal joint.  The tendon was freed off the proximal phalanx and the phalangeal head was exposed.  The proximal phalangeal head was excised with a sagittal saw.  The middle phalangeal base was then resected with sagittal saw to denude it of cartilage.     At this point the toe was able to be reduced to rectus position. The proximal phalanx was then drilled with a K wire and the wire was then passed up the distal aspect the toe through the middle phalanx with care to center the wire on the toe.  The toe was then reduced the pin was passed retrograde and positioning was checked on fluoroscopy.  Fluoroscopy confirmed positioning and then an incision was made at the distal aspect of the toe.  The distal phalanx was countersunk and a  2.5 millimeter screw was passed over the wire to fixate the toe in straight position.  Fluoroscopy was used to confirm reduction across the proximal interphalangeal joint and adequate placement of the screw.  The wounds were then copiously irrigated. The capsule and extensor tendon complex were then repaired with 3-0 Monocryl.  The subcu  tissue was then repaired with 3-0 Monocryl and 4-0 nylon.  Disposition: Following a period of post-operative monitoring, patient will be transferred home.

## 2021-06-02 NOTE — Discharge Instructions (Addendum)
  After Surgery Instructions   1) If you are recuperating from surgery anywhere other than home, please be sure to leave us the number where you can be reached.  2) Go directly home and rest.  3) Keep the operated foot(feet) elevated six inches above the hip when sitting or lying down. This will help control swelling and pain.  4) Support the elevated foot and leg with pillows. DO NOT PLACE PILLOWS UNDER THE KNEE.  5) DO NOT REMOVE or get your bandages WET, unless you were given different instructions by your doctor to do so. This increases the risk of infection.  6) Wear your surgical shoe or surgical boot at all times when you are up on your feet.  7) A limited amount of pain and swelling may occur. The skin may take on a bruised appearance. DO NOT BE ALARMED, THIS IS NORMAL.  8) For slight pain and swelling, apply an ice pack directly over the bandages for 15 minutes only out of each hour of the day. Continue until seen in the office for your first post op visit. DO NOT APPLY ANY FORM OF HEAT TO THE AREA.  9) Have prescriptions filled immediately and take as directed.  10) Drink lots of liquids, water and juice to stay hydrated.  11) CALL IMMEDIATELY IF:  *Bleeding continues until the following day of surgery  *Pain increases and/or does not respond to medication  *Bandages or cast appears to tight  *If your bandage gets wet  *Trip, fall or stump your surgical foot  *If your temperature goes above 101  *If you have ANY questions at all  12) You are expected to be weightbearing after your surgery.   If you need to reach the nurse for any reason, please call: [place]: [phone] [place]: [phone] [place]: [phone]     Post Anesthesia Home Care Instructions  Activity: Get plenty of rest for the remainder of the day. A responsible individual must stay with you for 24 hours following the procedure.  For the next 24 hours, DO NOT: -Drive  a car -Operate machinery -Drink alcoholic beverages -Take any medication unless instructed by your physician -Make any legal decisions or sign important papers.  Meals: Start with liquid foods such as gelatin or soup. Progress to regular foods as tolerated. Avoid greasy, spicy, heavy foods. If nausea and/or vomiting occur, drink only clear liquids until the nausea and/or vomiting subsides. Call your physician if vomiting continues.  Special Instructions/Symptoms: Your throat may feel dry or sore from the anesthesia or the breathing tube placed in your throat during surgery. If this causes discomfort, gargle with warm salt water. The discomfort should disappear within 24 hours.    

## 2021-06-02 NOTE — Transfer of Care (Signed)
Immediate Anesthesia Transfer of Care Note  Patient: Lindsey Potter  Procedure(s) Performed: Procedure(s) (LRB): HAMMER TOE CORRECTION 2ND TOE LEFT FOOT (Left) METATARSAL OSTEOTOMY (Left)  Patient Location: PACU  Anesthesia Type: MAC  Level of Consciousness: awake, sedated, patient cooperative and responds to stimulation  Airway & Oxygen Therapy: Patient Spontanous Breathing and Patient connected to face mask oxygen  Post-op Assessment: Report given to PACU RN, Post -op Vital signs reviewed and stable and Patient moving all extremities  Post vital signs: Reviewed and stable  Complications: No apparent anesthesia complications

## 2021-06-02 NOTE — Progress Notes (Signed)
Patient advised [place] this AM that she had no one that would be staying with her overnight. She does have a ride home. Patient's case worker will stay with her until 5pm tonight. Given the relatively light anesthesia (no general ET anesthesia) in discussion with anesthesia all agreed it was reasonable to proceed. We discussed what she would do in an emergency setting and she is aware of what to do and has some resources available.

## 2021-06-05 ENCOUNTER — Other Ambulatory Visit: Payer: Self-pay

## 2021-06-05 ENCOUNTER — Ambulatory Visit (INDEPENDENT_AMBULATORY_CARE_PROVIDER_SITE_OTHER): Payer: Commercial Managed Care - HMO | Admitting: Podiatry

## 2021-06-05 ENCOUNTER — Encounter (HOSPITAL_BASED_OUTPATIENT_CLINIC_OR_DEPARTMENT_OTHER): Payer: Self-pay | Admitting: Podiatry

## 2021-06-05 ENCOUNTER — Ambulatory Visit (INDEPENDENT_AMBULATORY_CARE_PROVIDER_SITE_OTHER): Payer: Commercial Managed Care - HMO

## 2021-06-05 VITALS — Temp 96.7°F

## 2021-06-05 DIAGNOSIS — M2042 Other hammer toe(s) (acquired), left foot: Secondary | ICD-10-CM

## 2021-06-05 DIAGNOSIS — M21962 Unspecified acquired deformity of left lower leg: Secondary | ICD-10-CM

## 2021-06-05 DIAGNOSIS — Z9889 Other specified postprocedural states: Secondary | ICD-10-CM

## 2021-06-05 NOTE — Progress Notes (Signed)
°  Subjective:  Patient ID: Lindsey Potter, female    DOB: [birth year],  MRN: [id number]  Chief Complaint  Patient presents with   Routine Post Op    I stumped my big toe yesterday and I am worried about it on the left foot    DOS: 05/31/21 Procedure: Weil Osteotomy and Hammertoe Correction 2nd toe left  71 y.o. female presents with the above complaint. History confirmed with patient.   Objective:  Physical Exam: tenderness at the surgical site, local edema noted, and calf supple, nontender. Incision: healing well, no dehiscence, no significant erythema, slight clear drainage present  No images are attached to the encounter.  Radiographs: X-ray of the left foot: consistent with post-op state, with good alignment and no evidence of hardware complication  Assessment:   1. Hammer toe of left foot   2. Metatarsal deformity, left   3. Post-operative state     Plan:  Patient was evaluated and treated and all questions answered.  Post-operative State -XR reviewed with patient -Dressing applied consisting of betadine, sterile gauze, kerlix, and ACE bandage -WBAT in Surgical shoe -Surgical shoe dispensed -XRs needed at follow-up: none  No follow-ups on file.

## 2021-06-08 ENCOUNTER — Encounter: Payer: Medicare Other | Admitting: Podiatry

## 2021-06-09 ENCOUNTER — Encounter: Payer: Medicare Other | Admitting: Podiatry

## 2021-06-15 ENCOUNTER — Ambulatory Visit (INDEPENDENT_AMBULATORY_CARE_PROVIDER_SITE_OTHER): Payer: Commercial Managed Care - HMO | Admitting: Podiatry

## 2021-06-15 ENCOUNTER — Other Ambulatory Visit: Payer: Self-pay

## 2021-06-15 ENCOUNTER — Encounter: Payer: Self-pay | Admitting: Podiatry

## 2021-06-15 ENCOUNTER — Encounter: Payer: Commercial Managed Care - HMO | Admitting: Podiatry

## 2021-06-15 VITALS — Temp 97.0°F

## 2021-06-15 DIAGNOSIS — M2042 Other hammer toe(s) (acquired), left foot: Secondary | ICD-10-CM

## 2021-06-15 DIAGNOSIS — Z9889 Other specified postprocedural states: Secondary | ICD-10-CM

## 2021-06-15 NOTE — Progress Notes (Signed)
°  Subjective:  Patient ID: Lindsey Potter, female    DOB: August 09, 1950,  MRN: [id number]  Chief Complaint  Patient presents with   Routine Post Op    I am doing ok on the left foot and I did fall a few days ago at the beauty shop and hurt my knee   DOS: 05/31/21 Procedure: Weil Osteotomy and Hammertoe Correction 2nd toe left  71 y.o. female presents with the above complaint. History confirmed with patient. Denies pain today. Oral temp 97.0. Denies N/V/F/Ch.  Objective:  Physical Exam: tenderness at the surgical site, local edema noted, and calf supple, nontender. Incision: healing well, no dehiscence, no significant erythema, slight clear drainage present   Assessment:   1. Hammer toe of left foot   2. Post-operative state     Plan:  Patient was evaluated and treated and all questions answered.  Post-operative State -Distal suture removal, proximal left intact. -Dressing applied consisting of abx ointment, sterile gauze, kerlix, and ACE bandage -WBAT in Surgical shoe  -XRs needed at follow-up: none  Return in about 1 day (around 06/16/2021) for Post-Op (No XRs) suture removal.

## 2021-06-20 ENCOUNTER — Encounter: Payer: Medicare Other | Admitting: Podiatry

## 2021-06-20 DIAGNOSIS — M2042 Other hammer toe(s) (acquired), left foot: Secondary | ICD-10-CM

## 2021-06-20 DIAGNOSIS — M7741 Metatarsalgia, right foot: Secondary | ICD-10-CM

## 2021-06-22 ENCOUNTER — Encounter: Payer: Medicare Other | Admitting: Podiatry

## 2021-06-22 ENCOUNTER — Ambulatory Visit (INDEPENDENT_AMBULATORY_CARE_PROVIDER_SITE_OTHER): Payer: Commercial Managed Care - HMO | Admitting: Podiatry

## 2021-06-22 ENCOUNTER — Encounter: Payer: Self-pay | Admitting: Podiatry

## 2021-06-22 ENCOUNTER — Telehealth: Payer: Self-pay

## 2021-06-22 VITALS — Temp 96.4°F

## 2021-06-22 DIAGNOSIS — Z9889 Other specified postprocedural states: Secondary | ICD-10-CM

## 2021-06-22 DIAGNOSIS — M2042 Other hammer toe(s) (acquired), left foot: Secondary | ICD-10-CM

## 2021-06-22 NOTE — Telephone Encounter (Signed)
Patient called the office the office wanting to know if she could continue her water exercises at the [facility] in the pool and hot tub.   Please advise .Marland Kitchen

## 2021-06-22 NOTE — Progress Notes (Signed)
°  Subjective:  Patient ID: Lindsey Potter, female    DOB: 10-Feb-1951,  MRN: [id number]  Chief Complaint  Patient presents with   Routine Post Op    Is my toe going to get straighter on the 2nd toe right foot    DOS: 05/31/21 Procedure: Weil Osteotomy and Hammertoe Correction 2nd toe left  71 y.o. female presents with the above complaint. History confirmed with patient. Denies pain today. Does not need refill of her pain medication.   Objective:  Physical Exam: tenderness at the surgical site, local edema noted, and calf supple, nontender. Toe rectus. Incision: healing well, no dehiscence, no significant erythema, slight clear drainage present   Assessment:   1. Hammer toe of left foot   2. Post-operative state      Plan:  Patient was evaluated and treated and all questions answered.  Post-operative State -I advised her toe is rather straight and once edema resolves she should be happier with the appearance. We discussed the main goal is for the toe not to hurt and her toe is significantly improved c/t pre-op. -Sutures removed. Steri strips applied -Ok to shower, no soaking. -WBAT in Surgical shoe  -XRs needed at follow-up: 3 view Foot  Return in about 2 weeks (around 07/06/2021) for Post-Op (with XRs).

## 2021-06-22 NOTE — Telephone Encounter (Signed)
Not for at least 3 weeks. Please inform

## 2021-06-23 ENCOUNTER — Encounter: Payer: Medicare Other | Admitting: Podiatry

## 2021-07-10 ENCOUNTER — Ambulatory Visit (INDEPENDENT_AMBULATORY_CARE_PROVIDER_SITE_OTHER): Payer: Medicare Other | Admitting: Podiatry

## 2021-07-10 ENCOUNTER — Encounter: Payer: Commercial Managed Care - HMO | Admitting: Podiatry

## 2021-07-10 DIAGNOSIS — Z91199 Patient's noncompliance with other medical treatment and regimen due to unspecified reason: Secondary | ICD-10-CM

## 2021-07-10 NOTE — Progress Notes (Signed)
No show for post-op appointment. 

## 2021-07-13 ENCOUNTER — Encounter: Payer: Commercial Managed Care - HMO | Admitting: Podiatry

## 2021-07-26 ENCOUNTER — Encounter: Payer: Medicare Other | Admitting: Sports Medicine

## 2021-09-19 ENCOUNTER — Ambulatory Visit (INDEPENDENT_AMBULATORY_CARE_PROVIDER_SITE_OTHER): Payer: Medicare Other | Admitting: Sports Medicine

## 2021-09-19 ENCOUNTER — Encounter: Payer: Self-pay | Admitting: Sports Medicine

## 2021-09-19 DIAGNOSIS — B351 Tinea unguium: Secondary | ICD-10-CM | POA: Diagnosis not present

## 2021-09-19 DIAGNOSIS — M79609 Pain in unspecified limb: Secondary | ICD-10-CM | POA: Diagnosis not present

## 2021-09-19 DIAGNOSIS — G244 Idiopathic orofacial dystonia: Secondary | ICD-10-CM | POA: Insufficient documentation

## 2021-09-19 MED ORDER — PREDNISONE 10 MG (21) PO TBPK: ORAL_TABLET | ORAL | 0 refills | Status: AC
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
## 2021-12-18 ENCOUNTER — Encounter: Payer: Self-pay | Admitting: Podiatry

## 2021-12-18 ENCOUNTER — Ambulatory Visit (INDEPENDENT_AMBULATORY_CARE_PROVIDER_SITE_OTHER): Payer: Medicare Other | Admitting: Podiatry

## 2021-12-18 DIAGNOSIS — M79609 Pain in unspecified limb: Secondary | ICD-10-CM | POA: Diagnosis not present

## 2021-12-18 DIAGNOSIS — B351 Tinea unguium: Secondary | ICD-10-CM
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
## 2022-01-22 DIAGNOSIS — R000 Tachycardia, unspecified: Secondary | ICD-10-CM | POA: Diagnosis not present

## 2022-01-22 DIAGNOSIS — I959 Hypotension, unspecified: Secondary | ICD-10-CM | POA: Diagnosis not present

## 2022-02-01 DIAGNOSIS — R000 Tachycardia, unspecified: Secondary | ICD-10-CM

## 2022-03-29 ENCOUNTER — Ambulatory Visit (INDEPENDENT_AMBULATORY_CARE_PROVIDER_SITE_OTHER): Payer: Self-pay | Admitting: Podiatry

## 2022-03-29 DIAGNOSIS — Z91199 Patient's noncompliance with other medical treatment and regimen due to unspecified reason: Secondary | ICD-10-CM

## 2022-03-29 NOTE — Progress Notes (Signed)
1. No-show for appointment
